# Patient Record
Sex: Female | Born: 1960 | Race: Black or African American | Hispanic: No | Marital: Single | Smoking: Never smoker
Health system: Southern US, Community
[De-identification: ages and names within clinical notes are randomized; demographics above are authoritative.]

## PROBLEM LIST (undated history)

## (undated) DIAGNOSIS — E782 Mixed hyperlipidemia: Secondary | ICD-10-CM

## (undated) DIAGNOSIS — K219 Gastro-esophageal reflux disease without esophagitis: Secondary | ICD-10-CM

## (undated) DIAGNOSIS — I10 Essential (primary) hypertension: Secondary | ICD-10-CM

## (undated) DIAGNOSIS — D219 Benign neoplasm of connective and other soft tissue, unspecified: Secondary | ICD-10-CM

## (undated) DIAGNOSIS — R7303 Prediabetes: Secondary | ICD-10-CM

## (undated) DIAGNOSIS — Z8616 Personal history of COVID-19: Secondary | ICD-10-CM

## (undated) HISTORY — DX: Prediabetes: R73.03

## (undated) HISTORY — DX: Personal history of COVID-19: Z86.16

## (undated) HISTORY — DX: Benign neoplasm of connective and other soft tissue, unspecified: D21.9

## (undated) HISTORY — DX: Gastro-esophageal reflux disease without esophagitis: K21.9

## (undated) HISTORY — DX: Mixed hyperlipidemia: E78.2

---

## 2004-11-30 ENCOUNTER — Ambulatory Visit: Payer: Self-pay | Admitting: Unknown Physician Specialty

## 2005-12-08 ENCOUNTER — Ambulatory Visit: Payer: Self-pay | Admitting: Unknown Physician Specialty

## 2006-12-12 ENCOUNTER — Ambulatory Visit: Payer: Self-pay | Admitting: Unknown Physician Specialty

## 2007-12-14 ENCOUNTER — Ambulatory Visit: Payer: Self-pay | Admitting: Unknown Physician Specialty

## 2008-12-17 ENCOUNTER — Ambulatory Visit: Payer: Self-pay | Admitting: Unknown Physician Specialty

## 2009-07-03 ENCOUNTER — Emergency Department: Payer: Self-pay | Admitting: Emergency Medicine

## 2009-09-07 ENCOUNTER — Emergency Department: Payer: Self-pay | Admitting: Emergency Medicine

## 2009-12-22 ENCOUNTER — Ambulatory Visit: Payer: Self-pay | Admitting: Unknown Physician Specialty

## 2010-05-19 ENCOUNTER — Emergency Department: Payer: Self-pay | Admitting: Emergency Medicine

## 2010-05-25 ENCOUNTER — Emergency Department: Payer: Self-pay | Admitting: Emergency Medicine

## 2010-07-16 ENCOUNTER — Emergency Department: Payer: Self-pay | Admitting: Emergency Medicine

## 2010-07-18 ENCOUNTER — Emergency Department: Payer: Self-pay | Admitting: Internal Medicine

## 2011-01-21 ENCOUNTER — Ambulatory Visit: Payer: Self-pay | Admitting: Unknown Physician Specialty

## 2012-03-11 ENCOUNTER — Emergency Department: Payer: Self-pay | Admitting: *Deleted

## 2012-03-11 LAB — CBC
HGB: 13 g/dL (ref 12.0–16.0)
MCH: 32.7 pg (ref 26.0–34.0)
MCHC: 35.8 g/dL (ref 32.0–36.0)
MCV: 91 fL (ref 80–100)
RBC: 3.98 10*6/uL (ref 3.80–5.20)
RDW: 13.4 % (ref 11.5–14.5)

## 2012-03-11 LAB — BASIC METABOLIC PANEL
Calcium, Total: 9.1 mg/dL (ref 8.5–10.1)
Chloride: 106 mmol/L (ref 98–107)
Co2: 25 mmol/L (ref 21–32)
EGFR (African American): >60
EGFR (Non-African Amer.): >60
Osmolality: 278 (ref 275–301)
Potassium: 3.7 mmol/L (ref 3.5–5.1)
Sodium: 139 mmol/L (ref 136–145)

## 2012-03-11 LAB — TROPONIN I: Troponin-I: <0.02 ng/mL

## 2012-03-11 LAB — CK TOTAL AND CKMB (NOT AT ARMC)
CK, Total: 146 U/L (ref 21–215)
CK-MB: 0.7 ng/mL (ref 0.5–3.6)

## 2015-03-06 ENCOUNTER — Telehealth: Payer: Self-pay | Admitting: *Deleted

## 2015-03-06 NOTE — Telephone Encounter (Signed)
Opened in error

## 2015-04-19 ENCOUNTER — Emergency Department
Admission: EM | Admit: 2015-04-19 | Discharge: 2015-04-19 | Disposition: A | Discharge status: Home / Self Care | Payer: 59 | Attending: Emergency Medicine | Admitting: Emergency Medicine

## 2015-04-19 ENCOUNTER — Encounter: Payer: Self-pay | Admitting: Emergency Medicine

## 2015-04-19 DIAGNOSIS — Z79899 Other long term (current) drug therapy: Secondary | ICD-10-CM | POA: Diagnosis not present

## 2015-04-19 DIAGNOSIS — T7840XA Allergy, unspecified, initial encounter: Secondary | ICD-10-CM | POA: Insufficient documentation

## 2015-04-19 DIAGNOSIS — Y9389 Activity, other specified: Secondary | ICD-10-CM | POA: Diagnosis not present

## 2015-04-19 DIAGNOSIS — Y998 Other external cause status: Secondary | ICD-10-CM | POA: Diagnosis not present

## 2015-04-19 DIAGNOSIS — I10 Essential (primary) hypertension: Secondary | ICD-10-CM | POA: Diagnosis not present

## 2015-04-19 DIAGNOSIS — Y9289 Other specified places as the place of occurrence of the external cause: Secondary | ICD-10-CM | POA: Diagnosis not present

## 2015-04-19 DIAGNOSIS — R220 Localized swelling, mass and lump, head: Secondary | ICD-10-CM | POA: Insufficient documentation

## 2015-04-19 DIAGNOSIS — X58XXXA Exposure to other specified factors, initial encounter: Secondary | ICD-10-CM | POA: Diagnosis not present

## 2015-04-19 HISTORY — DX: Essential (primary) hypertension: I10

## 2015-04-19 NOTE — ED Notes (Addendum)
Pt presents to ER with swollen lips; states she woke up around 11:30 last night noticing lip swelling; pt denies trying any new food or medication; pt called EMS around 1215 and took two benadryl and went back to sleep; pt states she came to ER when lips were still swollen when she woke up around 630; pt ambulatory in triage with no apparent distress noted

## 2015-04-19 NOTE — ED Notes (Signed)
States she developed facial swelling ,mainly around lips last pm. Had taken benadryl last pm but this am lips are still swollen and feels like throat is scratchy and swelling some. No drooling or resp distress noted

## 2015-04-19 NOTE — ED Provider Notes (Signed)
[facility] Emergency Department Provider Note  ____________________________________________  Time seen: Approximately 7:25 AM  I have reviewed the triage vital signs and the nursing notes.   HISTORY  Chief Complaint Allergic Reaction    HPI Jasmine Buck is a 54 y.o. female who presents to the emergency department complaining of lip swelling. She states that she woke up around 11:30 PM last night and noticed that her lips were swelling. She became concerned that she went to her sister's house to confirm that her lips or in fact swelling. At that time he called 911 and EMS arrived. Evaluated and given Benadryl and then refused transport. He states that she slept through the night and then woke up 6:30 this morning complaining of the same symptoms. She denies any shortness of breath, difficulty swallowing, generalized hives, itching, rash. She states that she has not tried any new foods, medications, soaps/shampoo, or beauty products.   Past Medical History  Diagnosis Date  . Hypertension     There are no active problems to display for this patient.   History reviewed. No pertinent past surgical history.  Current Outpatient Rx  Name  Route  Sig  Dispense  Refill  . amLODipine-benazepril (LOTREL) 5-10 MG capsule   Oral   Take 1 capsule by mouth daily.         . metoprolol succinate (TOPROL-XL) 50 MG 24 hr tablet   Oral   Take 50 mg by mouth daily. Take with or immediately following a meal.           Allergies Review of patient's allergies indicates not on file.  History reviewed. No pertinent family history.  Social History Social History  Substance Use Topics  . Smoking status: Never Smoker   . Smokeless tobacco: None  . Alcohol Use: No    Review of Systems Constitutional: No fever/chills Eyes: No visual changes. ENT: No sore throat. Cardiovascular: Denies chest pain. Respiratory: Denies shortness of breath. Gastrointestinal:  No abdominal pain.  No nausea, no vomiting.  No diarrhea.  No constipation. Genitourinary: Negative for dysuria. Musculoskeletal: Negative for back pain. Skin: Negative for rash. Edema to the upper lip. Neurological: Negative for headaches, focal weakness or numbness.  10-point ROS otherwise negative.  ____________________________________________   PHYSICAL EXAM:  VITAL SIGNS: ED Triage Vitals  Enc Vitals Group     BP 04/19/15 0706 125/65 mmHg     Pulse Rate 04/19/15 0706 93     Resp 04/19/15 0706 16     Temp 04/19/15 0706 98.2 F (36.8 C)     Temp Source 04/19/15 0706 Oral     SpO2 04/19/15 0706 100 %     Weight 04/19/15 0706 181 lb (82.101 kg)     Height 04/19/15 0706 5\' 2"  (1.575 m)     Head Cir --      Peak Flow --      Pain Score 04/19/15 0707 1     Pain Loc --      Pain Edu? --      Excl. in [place]? --     Constitutional: Alert and oriented. Well appearing and in no acute distress. Eyes: Conjunctivae are normal. PERRL. EOMI. Head: Atraumatic. Nose: No congestion/rhinnorhea. Mouth/Throat: Mucous membranes are moist.  Oropharynx non-erythematous. No edema noted. Tonsils nonedematous. Neck: No stridor.   Hematological/Lymphatic/Immunilogical: No cervical lymphadenopathy. Cardiovascular: Normal rate, regular rhythm. Grossly normal heart sounds.  Good peripheral circulation. Respiratory: Normal respiratory effort.  No retractions. Lungs CTAB. Gastrointestinal: Soft and  nontender. No distention. No abdominal bruits. No CVA tenderness. Musculoskeletal: No lower extremity tenderness nor edema.  No joint effusions. Neurologic:  Normal speech and language. No gross focal neurologic deficits are appreciated. No gait instability. Skin:  Skin is warm, dry and intact. No rash noted. Edema noted to upper lip.  Psychiatric: Mood and affect are normal. Speech and behavior are normal.  ____________________________________________   LABS (all labs ordered are listed, but only  abnormal results are displayed)  Labs Reviewed - No data to display ____________________________________________  EKG   ____________________________________________  RADIOLOGY   ____________________________________________   PROCEDURES  Procedure(s) performed: None  Critical Care performed: No  ____________________________________________   INITIAL IMPRESSION / ASSESSMENT AND PLAN / ED COURSE  Pertinent labs & imaging results that were available during my care of the patient were reviewed by me and considered in my medical decision making (see chart for details).  Patient's history, symptoms, and exam are consistent with allergic reaction to an unknown substance. The patient does not have any kind of respiratory distress. There is no wheezing, difficulty swallowing, difficulty breathing. There is no hives/rash noted. Asked findings with patient and diagnosis. Advised patient to continue taking Benadryl 50 mg every 4 hours. Also advised patient that she did take 4 times daily recommended dose of Zyrtec and Benadryl caused drowsiness. Patient verbalized understanding of same. Gave patient strict ED precautions to return should symptoms worsen, she develops any type of respiratory complaints, or she experiences difficulty swallowing. She verbalized understanding of same. She verbalizes compliance with all the above. ____________________________________________   FINAL CLINICAL IMPRESSION(S) / ED DIAGNOSES  Final diagnoses:  Allergic reaction, initial encounter      Darletta Moll, PA-C 04/19/15 0800  Schuyler Amor, MD 04/19/15 [phone]

## 2015-04-19 NOTE — Discharge Instructions (Signed)
Food Allergy A food allergy occurs from eating something you are sensitive to. Food allergies occur in all age groups. It may be passed to you from your parents (heredity).  CAUSES  Some common causes are cow's milk, seafood, eggs, nuts (including peanut butter), wheat, and soybeans. SYMPTOMS  Common problems are:   Swelling around the mouth.  An itchy, red rash.  Hives.  Vomiting.  Diarrhea. Severe allergic reactions are life-threatening. This reaction is called anaphylaxis. It can cause the mouth and throat to swell. This makes it hard to breathe and swallow. In severe reactions, only a small amount of food may be fatal within seconds. HOME CARE INSTRUCTIONS   If you are unsure what caused the reaction, keep a diary of foods eaten and symptoms that followed. Avoid foods that cause reactions.  If hives or rash are present:  Take medicines as directed.  Use an over-the-counter antihistamine (diphenhydramine) to treat hives and itching as needed.  Apply cold compresses to the skin or take baths in cool water. Avoid hot baths or showers. These will increase the redness and itching.  If you are severely allergic:  Hospitalization is often required following a severe reaction.  Wear a medical alert bracelet or necklace that describes the allergy.  Carry your anaphylaxis kit or epinephrine injection with you at all times. Both you and your family members should know how to use this. This can be lifesaving if you have a severe reaction. If epinephrine is used, it is important for you to seek immediate medical care or call your local emergency services (911 in [place] When the epinephrine wears off, it can be followed by a delayed reaction, which can be fatal.  Replace your epinephrine immediately after use in case of another reaction.  Ask your caregiver for instructions if you have not been taught how to use an epinephrine injection.  Do not drive until medicines used to treat the  reaction have worn off, unless approved by your caregiver. SEEK MEDICAL CARE IF:   You suspect a food allergy. Symptoms generally happen within 30 minutes of eating a food.  Your symptoms have not gone away within 2 days. See your caregiver sooner if symptoms are getting worse.  You develop new symptoms.  You want to retest yourself with a food or drink you think causes an allergic reaction. Never do this if an anaphylactic reaction to that food or drink has happened before.  There is a return of the symptoms which brought you to your caregiver. SEEK IMMEDIATE MEDICAL CARE IF:   You have trouble breathing, are wheezing, or you have a tight feeling in your chest or throat.  You have a swollen mouth, or you have hives, swelling, or itching all over your body. Use your epinephrine injection immediately. This is given into the outside of your thigh, deep into the muscle. Following use of the epinephrine injection, seek help right away. Seek immediate medical care or call your local emergency services (911 in [place] MAKE SURE YOU:   Understand these instructions.  Will watch your condition.  Will get help right away if you are not doing well or get worse. Document Released: 07/01/2000 Document Revised: 09/26/2011 Document Reviewed: 02/21/2008 [facility] Patient Information 2015 [place], [place]. This information is not intended to replace advice given to you by your health care provider. Make sure you discuss any questions you have with your health care provider.   Continue to take Benadryl for symptom relief. Return immediately to the emergency  department or call 911 for any increase of symptoms that include shortness of breath, difficulty swallowing, increased swelling.

## 2016-04-08 DIAGNOSIS — I499 Cardiac arrhythmia, unspecified: Secondary | ICD-10-CM | POA: Insufficient documentation

## 2016-04-08 DIAGNOSIS — I10 Essential (primary) hypertension: Secondary | ICD-10-CM | POA: Insufficient documentation

## 2016-04-08 DIAGNOSIS — R7303 Prediabetes: Secondary | ICD-10-CM | POA: Insufficient documentation

## 2016-04-23 ENCOUNTER — Encounter: Payer: Self-pay | Admitting: Emergency Medicine

## 2016-04-23 ENCOUNTER — Emergency Department
Admission: EM | Admit: 2016-04-23 | Discharge: 2016-04-23 | Disposition: A | Discharge status: Home / Self Care | Payer: 59 | Attending: Emergency Medicine | Admitting: Emergency Medicine

## 2016-04-23 DIAGNOSIS — I10 Essential (primary) hypertension: Secondary | ICD-10-CM | POA: Insufficient documentation

## 2016-04-23 DIAGNOSIS — H16142 Punctate keratitis, left eye: Secondary | ICD-10-CM | POA: Diagnosis not present

## 2016-04-23 DIAGNOSIS — H5712 Ocular pain, left eye: Secondary | ICD-10-CM | POA: Diagnosis present

## 2016-04-23 MED ORDER — OFLOXACIN 0.3 % OT SOLN: 5.0000 [drp] | Freq: Two times a day (BID) | OTIC | 0 refills | Status: AC

## 2016-04-23 MED ORDER — CIPROFLOXACIN HCL 0.3 % OP SOLN: 1.0000 [drp] | OPHTHALMIC | 0 refills | Status: AC

## 2016-04-23 MED ORDER — TETRACAINE HCL 0.5 % OP SOLN
OPHTHALMIC | Status: AC
  Filled 2016-04-23: qty 2

## 2016-04-23 MED ORDER — FLUORESCEIN SODIUM 1 MG OP STRP
ORAL_STRIP | OPHTHALMIC | Status: AC
  Filled 2016-04-23: qty 1

## 2016-04-23 NOTE — ED Notes (Signed)
Pt stating that her left eye has been sensitive to light and hurting. Pt stating that the people at the [facility] stated that it was not "pink eye." Pt stating that she has clear drainage or "tearing up" because of light this morning.  Pt denying any injuries. Pt does wear contacts.

## 2016-04-23 NOTE — ED Triage Notes (Signed)
States red left eye x 2 days. Still wearing her contacts

## 2016-04-23 NOTE — ED Provider Notes (Signed)
[facility] Emergency Department Provider Note ____________________________________________  Time seen: Approximately 2:31 PM  I have reviewed the triage vital signs and the nursing notes.   HISTORY  Chief Complaint Conjunctivitis   HPI Jasmine Buck is a 55 y.o. female who presents to the emergency department for left eyes sensitivity and pain. [facility] and was told that she does not have "pinkeye." States that she has clear drainage this morning. She denies injury. She does wear contact lenses.  Past Medical History:  Diagnosis Date  . Hypertension     There are no active problems to display for this patient.   History reviewed. No pertinent surgical history.  Prior to Admission medications   Medication Sig Start Date End Date Taking? Authorizing Provider  amLODipine-benazepril (LOTREL) 5-10 MG capsule Take 1 capsule by mouth daily.    Historical Provider, MD  ciprofloxacin (CILOXAN) 0.3 % ophthalmic solution Place 1 drop into the left eye every 4 (four) hours while awake. 04/23/16 04/28/16  Victorino Dike, FNP  metoprolol succinate (TOPROL-XL) 50 MG 24 hr tablet Take 50 mg by mouth daily. Take with or immediately following a meal.    Historical Provider, MD  ofloxacin (FLOXIN) 0.3 % otic solution Place 5 drops into both ears 2 (two) times daily. 04/23/16 05/03/16  Victorino Dike, FNP    Allergies Review of patient's allergies indicates no known allergies.  History reviewed. No pertinent family history.  Social History Social History  Substance Use Topics  . Smoking status: Never Smoker  . Smokeless tobacco: Never Used  . Alcohol use No    Review of Systems   Constitutional: No fever/chills Eyes: Negative for visual changes. Positive for photosensitivity. Musculoskeletal: Negative for pain. Skin: Negative for rash. Neurological: Negative for headaches, focal weakness or numbness. Allergic: Negative for  seasonal allergies. ____________________________________________  PHYSICAL EXAM:  VITAL SIGNS: ED Triage Vitals [04/23/16 1318]  Enc Vitals Group     BP      Pulse      Resp      Temp      Temp src      SpO2      Weight      Height      Head Circumference      Peak Flow      Pain Score 2     Pain Loc      Pain Edu?      Excl. in [place]?     Constitutional: Alert and oriented. Well appearing and in no acute distress. Eyes: Visual acuity--see nursing documentation; no globe trauma; Eyelids normal to inspection; Sclera appears anicteric.  Eyelids were inverted. Conjunctiva appears mildly erythematous; Cornea with punctate lesions on fluorescein exam. Head: Atraumatic. Nose: No congestion/rhinnorhea. Mouth/Throat: Mucous membranes are moist.  Oropharynx non-erythematous. Respiratory: Even and unlabored Musculoskeletal:Normal ROM x 4 extremities. Neurologic:  Normal speech and language. No gross focal neurologic deficits are appreciated. Speech is normal. No gait instability. Skin:  Skin is warm, dry and intact. No rash noted. Psychiatric: Mood and affect are normal. Speech and behavior are normal.  ____________________________________________   LABS (all labs ordered are listed, but only abnormal results are displayed)  Labs Reviewed - No data to display ____________________________________________  EKG   ____________________________________________  RADIOLOGY  Not indicated ____________________________________________   PROCEDURES  Procedure(s) performed: None ____________________________________________   INITIAL IMPRESSION / ASSESSMENT AND PLAN / ED COURSE  Pertinent labs & imaging results that were available during  my care of the patient were reviewed by me and considered in my medical decision making (see chart for details).  Clinical Course    Patient to receive prescriptions for Ciprofloxacin eyedrops.  She was advised to follow up with her  ophthalmologist for symptoms that are not improving over the next 2 days. She was  also advised to return to the ER for symptoms that change or worsen if unable to schedule an appointment.  ____________________________________________   FINAL CLINICAL IMPRESSION(S) / ED DIAGNOSES  Final diagnoses:  Punctate keratitis of left eye    Note:  This document was prepared using Dragon voice recognition software and may include unintentional dictation errors.    Victorino Dike, FNP 04/23/16 1815    Lisa Roca, MD 04/24/16 1535

## 2016-05-02 ENCOUNTER — Other Ambulatory Visit: Payer: Self-pay | Admitting: Obstetrics & Gynecology

## 2016-05-02 DIAGNOSIS — Z1231 Encounter for screening mammogram for malignant neoplasm of breast: Secondary | ICD-10-CM

## 2016-06-02 ENCOUNTER — Ambulatory Visit
Admission: RE | Admit: 2016-06-02 | Discharge: 2016-06-02 | Disposition: A | Discharge status: Home / Self Care | Payer: 59 | Source: Ambulatory Visit | Attending: Obstetrics & Gynecology | Admitting: Obstetrics & Gynecology

## 2016-06-02 DIAGNOSIS — Z1231 Encounter for screening mammogram for malignant neoplasm of breast: Secondary | ICD-10-CM | POA: Insufficient documentation

## 2016-06-07 ENCOUNTER — Other Ambulatory Visit: Payer: Self-pay | Admitting: *Deleted

## 2016-06-07 ENCOUNTER — Inpatient Hospital Stay
Admission: RE | Admit: 2016-06-07 | Discharge: 2016-06-07 | Disposition: A | Discharge status: Home / Self Care | Payer: Self-pay | Source: Ambulatory Visit | Attending: *Deleted | Admitting: *Deleted

## 2016-06-07 DIAGNOSIS — Z9289 Personal history of other medical treatment: Secondary | ICD-10-CM

## 2017-04-11 ENCOUNTER — Other Ambulatory Visit: Payer: Self-pay | Admitting: Obstetrics & Gynecology

## 2017-04-11 DIAGNOSIS — Z1231 Encounter for screening mammogram for malignant neoplasm of breast: Secondary | ICD-10-CM

## 2017-06-05 ENCOUNTER — Ambulatory Visit
Admission: RE | Admit: 2017-06-05 | Discharge: 2017-06-05 | Disposition: A | Discharge status: Home / Self Care | Payer: 59 | Source: Ambulatory Visit | Attending: Obstetrics & Gynecology | Admitting: Obstetrics & Gynecology

## 2017-06-05 DIAGNOSIS — Z1231 Encounter for screening mammogram for malignant neoplasm of breast: Secondary | ICD-10-CM

## 2018-02-03 ENCOUNTER — Encounter: Payer: Self-pay | Admitting: Emergency Medicine

## 2018-02-03 ENCOUNTER — Emergency Department
Admission: EM | Admit: 2018-02-03 | Discharge: 2018-02-03 | Disposition: A | Discharge status: Home / Self Care | Payer: Managed Care, Other (non HMO) | Attending: Emergency Medicine | Admitting: Emergency Medicine

## 2018-02-03 ENCOUNTER — Emergency Department: Payer: Managed Care, Other (non HMO)

## 2018-02-03 DIAGNOSIS — R002 Palpitations: Secondary | ICD-10-CM

## 2018-02-03 DIAGNOSIS — Z79899 Other long term (current) drug therapy: Secondary | ICD-10-CM | POA: Diagnosis not present

## 2018-02-03 DIAGNOSIS — I10 Essential (primary) hypertension: Secondary | ICD-10-CM | POA: Insufficient documentation

## 2018-02-03 DIAGNOSIS — R000 Tachycardia, unspecified: Secondary | ICD-10-CM | POA: Diagnosis present

## 2018-02-03 LAB — BASIC METABOLIC PANEL
Anion gap: 9 (ref 5–15)
BUN: 13 mg/dL (ref 6–20)
CHLORIDE: 106 mmol/L (ref 98–111)
CO2: 25 mmol/L (ref 22–32)
Calcium: 9.7 mg/dL (ref 8.9–10.3)
Creatinine, Ser: 0.91 mg/dL (ref 0.44–1.00)
GFR calc Af Amer: >60 mL/min (ref >60)
GFR calc non Af Amer: >60 mL/min (ref >60)
Glucose, Bld: 145 mg/dL — ABNORMAL HIGH (ref 70–99)
Potassium: 3.7 mmol/L (ref 3.5–5.1)
Sodium: 140 mmol/L (ref 135–145)

## 2018-02-03 LAB — CBC
HCT: 36.1 % (ref 35.0–47.0)
Hemoglobin: 12.6 g/dL (ref 12.0–16.0)
MCH: 29.9 pg (ref 26.0–34.0)
MCHC: 34.8 g/dL (ref 32.0–36.0)
MCV: 85.9 fL (ref 80.0–100.0)
Platelets: 374 10*3/uL (ref 150–440)
RBC: 4.2 MIL/uL (ref 3.80–5.20)
RDW: 14.3 % (ref 11.5–14.5)
WBC: 8.2 10*3/uL (ref 3.6–11.0)

## 2018-02-03 LAB — TSH: TSH: 2.042 u[IU]/mL (ref 0.350–4.500)

## 2018-02-03 LAB — TROPONIN I

## 2018-02-03 NOTE — ED Triage Notes (Signed)
PT arrived via ems with concerns over her heart which pt reports "felt like it was racing." pt denies any pain and is in NAD. HR 103 currently

## 2018-02-03 NOTE — ED Notes (Signed)
.   Pt is resting, Respirations even and unlabored, NAD. Stretcher lowest postion and locked. Call bell within reach. Denies any needs at this time RN will continue to monitor.    

## 2018-02-03 NOTE — ED Provider Notes (Signed)
[facility] Emergency Department Provider Note  ____________________________________________   I have reviewed the triage vital signs and the nursing notes. Where available I have reviewed prior notes and, if possible and indicated, outside hospital notes.    HISTORY  Chief Complaint Tachycardia    HPI Jasmine Buck is a 56 y.o. female who presents today complaining of having her heart rate go fast this morning.  She checked her pulse and it was 108.  She states she became very anxious about this and she called 911.  She had no chest pain no shortness of breath no nausea no vomiting no personal family history of PE or DVT no leg swelling no respiratory issues, no dyspnea, no exertional symptoms, no chest pain, she states that she has had this happen to her before she does have a history of anxiety she states.  She has no SI or HI and she does not feel particularly anxious at this time but she does state that she was anxious this morning.  She states usually when these things happen the last for a brief period of time go away.  She states she has a sensation of palpitations.  She is never had a Holter monitor seen a cardiologist.  She has no symptoms at this time and she is eager to go home if possible.   Past Medical History:  Diagnosis Date  . Hypertension     There are no active problems to display for this patient.   History reviewed. No pertinent surgical history.  Prior to Admission medications   Medication Sig Start Date End Date Taking? Authorizing Provider  amLODipine-benazepril (LOTREL) 5-10 MG capsule Take 1 capsule by mouth daily.    [provider]  metoprolol succinate (TOPROL-XL) 50 MG 24 hr tablet Take 50 mg by mouth daily. Take with or immediately following a meal.    [provider]    Allergies Patient has no known allergies.  No family history on file.  Social History Social History   Tobacco Use  . Smoking  status: Never Smoker  . Smokeless tobacco: Never Used  Substance Use Topics  . Alcohol use: No  . Drug use: No    Review of Systems Constitutional: No fever/chills Eyes: No visual changes. ENT: No sore throat. No stiff neck no neck pain Cardiovascular: Denies chest pain. Respiratory: Denies shortness of breath. Gastrointestinal:   no vomiting.  No diarrhea.  No constipation. Genitourinary: Negative for dysuria. Musculoskeletal: Negative lower extremity swelling Skin: Negative for rash. Neurological: Negative for severe headaches, focal weakness or numbness.   ____________________________________________   PHYSICAL EXAM:  VITAL SIGNS: ED Triage Vitals  Enc Vitals Group     BP 02/03/18 0957 (!) 148/68     Pulse Rate 02/03/18 0957 100     Resp 02/03/18 0957 (!) 21     Temp 02/03/18 0957 98.3 F (36.8 C)     Temp Source 02/03/18 0957 Oral     SpO2 02/03/18 0957 98 %     Weight 02/03/18 0958 188 lb (85.3 kg)     Height 02/03/18 0958 5\' 2"  (1.575 m)     Head Circumference --      Peak Flow --      Pain Score 02/03/18 0958 0     Pain Loc --      Pain Edu? --      Excl. in [place]? --     Constitutional: Alert and oriented. Well appearing and in no acute  distress. Eyes: Conjunctivae are normal Head: Atraumatic HEENT: No congestion/rhinnorhea. Mucous membranes are moist.  Oropharynx non-erythematous Neck:   Nontender with no meningismus, no masses, no stridor Cardiovascular: Normal rate, regular rhythm. Grossly normal heart sounds.  Good peripheral circulation. Respiratory: Normal respiratory effort.  No retractions. Lungs CTAB. Abdominal: Soft and nontender. No distention. No guarding no rebound Back:  There is no focal tenderness or step off.  there is no midline tenderness there are no lesions noted. there is no CVA tenderness Musculoskeletal: No lower extremity tenderness, no upper extremity tenderness. No joint effusions, no DVT signs strong distal pulses no  edema Neurologic:  Normal speech and language. No gross focal neurologic deficits are appreciated.  Skin:  Skin is warm, dry and intact. No rash noted. Psychiatric: Mood and affect are somewhat anxious. Speech and behavior are normal.  ____________________________________________   LABS (all labs ordered are listed, but only abnormal results are displayed)  Labs Reviewed  BASIC METABOLIC PANEL - Abnormal; Notable for the following components:      Result Value   Glucose, Bld 145 (*)    All other components within normal limits  CBC  TROPONIN I  TSH    Pertinent labs  results that were available during my care of the patient were reviewed by me and considered in my medical decision making (see chart for details). ____________________________________________  EKG  I personally interpreted any EKGs ordered by me or triage Sinus rhythm rate 90 bpm no acute ST elevation or depression, nonspecific ST changes, no acute ischemia.  QTc 419 QT 320 ____________________________________________  RADIOLOGY  Pertinent labs & imaging results that were available during my care of the patient were reviewed by me and considered in my medical decision making (see chart for details). If possible, patient and/or family made aware of any abnormal findings.  Dg Chest 2 View  Result Date: 02/03/2018 CLINICAL DATA:  Chest discomfort, weakness, tachycardia EXAM: CHEST - 2 VIEW COMPARISON:  03/11/2012 FINDINGS: The heart size and mediastinal contours are within normal limits. Both lungs are clear. The visualized skeletal structures are unremarkable except for diffuse thoracic spondylosis. Trachea is midline. IMPRESSION: No active cardiopulmonary disease. Electronically Signed   By: Jerilynn Mages.  Shick M.D.   On: 02/03/2018 10:34   ____________________________________________    PROCEDURES  Procedure(s) performed: None  Procedures  Critical Care performed:  None  ____________________________________________   INITIAL IMPRESSION / ASSESSMENT AND PLAN / ED COURSE  Pertinent labs & imaging results that were available during my care of the patient were reviewed by me and considered in my medical decision making (see chart for details).  Patient with palpitations this morning asymptomatic otherwise, in no acute distress here, EKG does not show any dysrhythmia, patient has no risk factors for PE and actually no signs or symptoms of PE, nothing to suggest ACS, there is actually no chest pain or shortness of breath she just felt her heart beating rapidly.  TSH is reassuring she is not anemic, vital signs are reassuring here EKG is reassuring we will discharge her with close outpatient follow-up for Holter monitor.  Precautions and follow-up given and understood.    ____________________________________________   FINAL CLINICAL IMPRESSION(S) / ED DIAGNOSES  Final diagnoses:  None      This chart was dictated using voice recognition software.  Despite best efforts to proofread,  errors can occur which can change meaning.      Schuyler Amor, MD 02/03/18 [phone]

## 2018-02-03 NOTE — ED Notes (Signed)
Patient transported to X-ray 

## 2018-04-13 ENCOUNTER — Other Ambulatory Visit: Payer: Self-pay | Admitting: Obstetrics & Gynecology

## 2018-04-13 DIAGNOSIS — Z1231 Encounter for screening mammogram for malignant neoplasm of breast: Secondary | ICD-10-CM

## 2018-06-08 ENCOUNTER — Ambulatory Visit
Admission: RE | Admit: 2018-06-08 | Discharge: 2018-06-08 | Disposition: A | Discharge status: Home / Self Care | Payer: Managed Care, Other (non HMO) | Source: Ambulatory Visit | Attending: Obstetrics & Gynecology | Admitting: Obstetrics & Gynecology

## 2018-06-08 DIAGNOSIS — Z1231 Encounter for screening mammogram for malignant neoplasm of breast: Secondary | ICD-10-CM | POA: Diagnosis not present

## 2019-01-20 ENCOUNTER — Other Ambulatory Visit: Payer: Self-pay

## 2019-01-20 ENCOUNTER — Emergency Department
Admission: EM | Admit: 2019-01-20 | Discharge: 2019-01-20 | Disposition: A | Discharge status: Home / Self Care | Payer: Managed Care, Other (non HMO) | Attending: Emergency Medicine | Admitting: Emergency Medicine

## 2019-01-20 DIAGNOSIS — E860 Dehydration: Secondary | ICD-10-CM | POA: Insufficient documentation

## 2019-01-20 DIAGNOSIS — I10 Essential (primary) hypertension: Secondary | ICD-10-CM | POA: Insufficient documentation

## 2019-01-20 DIAGNOSIS — R42 Dizziness and giddiness: Secondary | ICD-10-CM | POA: Insufficient documentation

## 2019-01-20 LAB — URINALYSIS, COMPLETE (UACMP) WITH MICROSCOPIC
Bacteria, UA: NONE SEEN
Bilirubin Urine: NEGATIVE
Glucose, UA: NEGATIVE mg/dL
Ketones, ur: NEGATIVE mg/dL
Leukocytes,Ua: NEGATIVE
Nitrite: NEGATIVE
Protein, ur: NEGATIVE mg/dL
Specific Gravity, Urine: 1.011 (ref 1.005–1.030)
pH: 6 (ref 5.0–8.0)

## 2019-01-20 LAB — CBC
HCT: 38.3 % (ref 36.0–46.0)
Hemoglobin: 12.4 g/dL (ref 12.0–15.0)
MCH: 28.2 pg (ref 26.0–34.0)
MCHC: 32.4 g/dL (ref 30.0–36.0)
MCV: 87.2 fL (ref 80.0–100.0)
Platelets: 353 10*3/uL (ref 150–400)
RBC: 4.39 MIL/uL (ref 3.87–5.11)
RDW: 13.2 % (ref 11.5–15.5)
WBC: 7.6 10*3/uL (ref 4.0–10.5)
nRBC: 0 % (ref 0.0–0.2)

## 2019-01-20 LAB — BASIC METABOLIC PANEL
Anion gap: 8 (ref 5–15)
BUN: 16 mg/dL (ref 6–20)
CO2: 24 mmol/L (ref 22–32)
Calcium: 9.2 mg/dL (ref 8.9–10.3)
Chloride: 104 mmol/L (ref 98–111)
Creatinine, Ser: 0.81 mg/dL (ref 0.44–1.00)
GFR calc Af Amer: >60 mL/min (ref >60)
GFR calc non Af Amer: >60 mL/min (ref >60)
Glucose, Bld: 113 mg/dL — ABNORMAL HIGH (ref 70–99)
Potassium: 3.8 mmol/L (ref 3.5–5.1)
Sodium: 136 mmol/L (ref 135–145)

## 2019-01-20 MED ORDER — SODIUM CHLORIDE 0.9 % IV BOLUS
1000.0000 mL | Freq: Once | INTRAVENOUS | Status: AC
  Administered 2019-01-20: 1000 mL via INTRAVENOUS

## 2019-01-20 NOTE — ED Triage Notes (Addendum)
Pt arrived via POV with reports of lightheadedness that started this morning that was intermittent several times since this morning. Pt states she was sitting on the deck drinking coffee when it happened.   Denies any pain, vision changes  Pt does have hx of HTN and took med about 10am.

## 2019-01-20 NOTE — ED Provider Notes (Signed)
[facility] Emergency Department Provider Note   ____________________________________________   First MD Initiated Contact with Patient 01/20/19 1107     (approximate)  I have reviewed the triage vital signs and the nursing notes.   HISTORY  Chief Complaint Dizziness    HPI Jasmine Buck is a 58 y.o. female here for evaluation for feeling lightheaded this morning  Patient reports she got up, she was sitting outside and just started drinking a couple coffee as she was seated she started to feel very lightheaded.  She did not pass out or anything, drank a glass of water after that she also felt thirsty.  Then again she had another episode when she was where she feels lightheaded.  No chest pain no shortness of breath.  No nausea no vomiting.  History of hypertension and prediabetes.   No headache.  No fevers or chills.  No recent illness.  No exposure to coronavirus.  Works from home.  Patient did have a few alcoholic beverages last night to celebrate 4 July.  She also reports she has not had breakfast yet today  Past Medical History:  Diagnosis Date  . Hypertension     There are no active problems to display for this patient.   No past surgical history on file.  Prior to Admission medications   Medication Sig Start Date End Date Taking? Authorizing Provider  amLODipine-benazepril (LOTREL) 5-10 MG capsule Take 1 capsule by mouth daily.    [provider]  metoprolol succinate (TOPROL-XL) 50 MG 24 hr tablet Take 50 mg by mouth daily. Take with or immediately following a meal.    [provider]  Patient reports she is stopped taking Benzapril, she has an ACE allergy  Allergies Ace inhibitors  No family history on file.  Social History Social History   Tobacco Use  . Smoking status: Never Smoker  . Smokeless tobacco: Never Used  Substance Use Topics  . Alcohol use: No  . Drug use: No  Normally does not drink  regularly, last night had a few drinks to celebrate 4 July  Review of Systems Constitutional: No fever/chills Eyes: No visual changes. ENT: No sore throat. Cardiovascular: Denies chest pain. Respiratory: Denies shortness of breath. Gastrointestinal: No abdominal pain.   Genitourinary: Negative for dysuria.  Is in menopause. Musculoskeletal: Negative for back pain. Skin: Negative for rash. Neurological: Negative for headaches, areas of focal weakness or numbness.  Just felt lightheaded with sitting up and standing.  No numbness or weakness.  No droop of the face.  No trouble speaking.    ____________________________________________   PHYSICAL EXAM:  VITAL SIGNS: ED Triage Vitals  Enc Vitals Group     BP 01/20/19 1028 (!) 133/57     Pulse Rate 01/20/19 1028 96     Resp 01/20/19 1028 18     Temp 01/20/19 1028 98.6 F (37 C)     Temp Source 01/20/19 1028 Oral     SpO2 01/20/19 1028 98 %     Weight 01/20/19 1026 189 lb (85.7 kg)     Height 01/20/19 1026 5\' 2"  (1.575 m)     Head Circumference --      Peak Flow --      Pain Score 01/20/19 1026 0     Pain Loc --      Pain Edu? --      Excl. in [place]? --     Constitutional: Alert and oriented. Well appearing and in no acute  distress. Eyes: Conjunctivae are normal. Head: Atraumatic. Nose: No congestion/rhinnorhea. Mouth/Throat: Mucous membranes are moist. Neck: No stridor.  Cardiovascular: Normal rate, regular rhythm. Grossly normal heart sounds.  Good peripheral circulation. Respiratory: Normal respiratory effort.  No retractions. Lungs CTAB. Gastrointestinal: Soft and nontender. No distention. Musculoskeletal: No lower extremity tenderness nor edema. Neurologic:  Normal speech and language. No gross focal neurologic deficits are appreciated.  No pronator drift in arms or legs.  Cranial nerve exam normal.  Extraocular movements normal.  Normal sensation and strength in all extremities.  Clear speech.  No ataxia Skin:  Skin is  warm, dry and intact. No rash noted. Psychiatric: Mood and affect are normal. Speech and behavior are normal.  ____________________________________________   LABS (all labs ordered are listed, but only abnormal results are displayed)  Labs Reviewed  BASIC METABOLIC PANEL - Abnormal; Notable for the following components:      Result Value   Glucose, Bld 113 (*)    All other components within normal limits  URINALYSIS, COMPLETE (UACMP) WITH MICROSCOPIC - Abnormal; Notable for the following components:   Color, Urine YELLOW (*)    APPearance CLEAR (*)    Hgb urine dipstick MODERATE (*)    All other components within normal limits  CBC   ____________________________________________  EKG  Reviewed entered by me at 11 AM Heart rate 90 QRS 90 QTc 440 Normal sinus rhythm, no evidence of acute ischemia ____________________________________________    Normal and reassuring neurologic exam without acute neurologic symptoms.  No indication for imaging ____________________________________________   PROCEDURES  Procedure(s) performed: None  Procedures  Critical Care performed: No  ____________________________________________   INITIAL IMPRESSION / ASSESSMENT AND PLAN / ED COURSE  Pertinent labs & imaging results that were available during my care of the patient were reviewed by me and considered in my medical decision making (see chart for details).   Lightheadedness.  Symptoms occurring when sitting up or standing.  Given her history of alcohol use last night to celebrate 4 July, getting up this morning and not having had breakfast or drink anything I suspect she may be dehydrated.  We will start IV fluids, check lab work to assure normal electrolytes and creatinine.  Also check for acute anemia.  She denies any acute cardiopulmonary neurologic symptoms.  Her symptoms seem very positional and I suspect dehydration is likely to blame here, but wish to exclude other causes.  Will  hydrate and reevaluate.     ----------------------------------------- 1:15 PM on 01/20/2019 -----------------------------------------  Patient resting comfortably.  She feels much better.  No ongoing symptoms or lightheadedness.  Patient comfortable with plan for discharge.  Return precautions and treatment recommendations and follow-up discussed with the patient who is agreeable with the plan.  Husband here who will be driving her home ____________________________________________   FINAL CLINICAL IMPRESSION(S) / ED DIAGNOSES  Final diagnoses:  Lightheadedness  Dehydration        Note:  This document was prepared using Dragon voice recognition software and may include unintentional dictation errors       Delman Kitten, MD 01/20/19 1316

## 2019-01-20 NOTE — ED Notes (Signed)
Patient giving urine sample at this time

## 2019-04-18 ENCOUNTER — Other Ambulatory Visit: Payer: Self-pay | Admitting: Obstetrics & Gynecology

## 2019-04-18 DIAGNOSIS — Z1231 Encounter for screening mammogram for malignant neoplasm of breast: Secondary | ICD-10-CM

## 2019-04-29 DIAGNOSIS — D251 Intramural leiomyoma of uterus: Secondary | ICD-10-CM | POA: Insufficient documentation

## 2019-06-10 ENCOUNTER — Ambulatory Visit
Admission: RE | Admit: 2019-06-10 | Discharge: 2019-06-10 | Disposition: A | Discharge status: Home / Self Care | Payer: Managed Care, Other (non HMO) | Source: Ambulatory Visit | Attending: Obstetrics & Gynecology | Admitting: Obstetrics & Gynecology

## 2019-06-10 DIAGNOSIS — Z1231 Encounter for screening mammogram for malignant neoplasm of breast: Secondary | ICD-10-CM | POA: Insufficient documentation

## 2019-09-26 ENCOUNTER — Other Ambulatory Visit: Payer: Self-pay

## 2019-09-26 ENCOUNTER — Ambulatory Visit: Payer: Managed Care, Other (non HMO) | Attending: Internal Medicine

## 2019-09-26 DIAGNOSIS — Z23 Encounter for immunization: Secondary | ICD-10-CM

## 2019-09-26 NOTE — Progress Notes (Signed)
   Covid-19 Vaccination Clinic  Name:  Jasmine Buck    MRN: [id number] DOB: 1961-02-22  09/26/2019  [name] was observed post Covid-19 immunization for 15 minutes without incident. She was provided with Vaccine Information Sheet and instruction to access the V-Safe system.   [name] was instructed to call 911 with any severe reactions post vaccine: Marland Kitchen Difficulty breathing  . Swelling of face and throat  . A fast heartbeat  . A bad rash all over body  . Dizziness and weakness   Immunizations Administered    Name Date Dose VIS Date Route   Pfizer COVID-19 Vaccine 09/26/2019 10:50 AM 0.3 mL 06/28/2019 Intramuscular   Manufacturer: [place]   Lot: [id number]   [place]: [id number]

## 2019-10-23 ENCOUNTER — Ambulatory Visit: Payer: Managed Care, Other (non HMO) | Attending: Internal Medicine

## 2019-10-23 DIAGNOSIS — Z23 Encounter for immunization: Secondary | ICD-10-CM

## 2019-10-23 NOTE — Progress Notes (Signed)
   Covid-19 Vaccination Clinic  Name:  Jasmine Buck    MRN: [id number] DOB: 22-Dec-1960  10/23/2019  [name] was observed post Covid-19 immunization for 15 minutes without incident. She was provided with Vaccine Information Sheet and instruction to access the V-Safe system.   [name] was instructed to call 911 with any severe reactions post vaccine: Marland Kitchen Difficulty breathing  . Swelling of face and throat  . A fast heartbeat  . A bad rash all over body  . Dizziness and weakness   Immunizations Administered    Name Date Dose VIS Date Route   Pfizer COVID-19 Vaccine 10/23/2019  9:00 AM 0.3 mL 06/28/2019 Intramuscular   Manufacturer: [place]   Lot: [id number]   [place]: [id number]

## 2020-05-15 ENCOUNTER — Other Ambulatory Visit: Payer: Self-pay | Admitting: Obstetrics & Gynecology

## 2020-05-26 ENCOUNTER — Other Ambulatory Visit: Payer: Self-pay | Admitting: Obstetrics & Gynecology

## 2020-05-26 DIAGNOSIS — Z1231 Encounter for screening mammogram for malignant neoplasm of breast: Secondary | ICD-10-CM

## 2020-07-03 ENCOUNTER — Ambulatory Visit
Admission: RE | Admit: 2020-07-03 | Discharge: 2020-07-03 | Disposition: A | Discharge status: Home / Self Care | Payer: Managed Care, Other (non HMO) | Source: Ambulatory Visit | Attending: Obstetrics & Gynecology | Admitting: Obstetrics & Gynecology

## 2020-07-03 ENCOUNTER — Other Ambulatory Visit: Payer: Self-pay

## 2020-07-03 DIAGNOSIS — Z1231 Encounter for screening mammogram for malignant neoplasm of breast: Secondary | ICD-10-CM | POA: Diagnosis present

## 2021-02-21 ENCOUNTER — Emergency Department
Admission: EM | Admit: 2021-02-21 | Discharge: 2021-02-21 | Disposition: A | Discharge status: Home / Self Care | Payer: Managed Care, Other (non HMO) | Attending: Emergency Medicine | Admitting: Emergency Medicine

## 2021-02-21 ENCOUNTER — Emergency Department: Payer: Managed Care, Other (non HMO)

## 2021-02-21 ENCOUNTER — Encounter: Payer: Self-pay | Admitting: Emergency Medicine

## 2021-02-21 ENCOUNTER — Other Ambulatory Visit: Payer: Self-pay

## 2021-02-21 DIAGNOSIS — I10 Essential (primary) hypertension: Secondary | ICD-10-CM | POA: Diagnosis not present

## 2021-02-21 DIAGNOSIS — Z79899 Other long term (current) drug therapy: Secondary | ICD-10-CM | POA: Diagnosis not present

## 2021-02-21 DIAGNOSIS — R42 Dizziness and giddiness: Secondary | ICD-10-CM | POA: Diagnosis not present

## 2021-02-21 DIAGNOSIS — R000 Tachycardia, unspecified: Secondary | ICD-10-CM | POA: Insufficient documentation

## 2021-02-21 DIAGNOSIS — R002 Palpitations: Secondary | ICD-10-CM

## 2021-02-21 LAB — TROPONIN I (HIGH SENSITIVITY)
Troponin I (High Sensitivity): 5 ng/L (ref <18)
Troponin I (High Sensitivity): 12 ng/L (ref <18)
Troponin I (High Sensitivity): 12 ng/L (ref <18)

## 2021-02-21 LAB — CBC
HCT: 37.3 % (ref 36.0–46.0)
Hemoglobin: 12.6 g/dL (ref 12.0–15.0)
MCH: 29.7 pg (ref 26.0–34.0)
MCHC: 33.8 g/dL (ref 30.0–36.0)
MCV: 88 fL (ref 80.0–100.0)
Platelets: 393 10*3/uL (ref 150–400)
RBC: 4.24 MIL/uL (ref 3.87–5.11)
RDW: 13.2 % (ref 11.5–15.5)
WBC: 11.4 10*3/uL — ABNORMAL HIGH (ref 4.0–10.5)
nRBC: 0 % (ref 0.0–0.2)

## 2021-02-21 LAB — BASIC METABOLIC PANEL
Anion gap: 9 (ref 5–15)
BUN: 15 mg/dL (ref 6–20)
CO2: 23 mmol/L (ref 22–32)
Calcium: 9.4 mg/dL (ref 8.9–10.3)
Chloride: 104 mmol/L (ref 98–111)
Creatinine, Ser: 0.96 mg/dL (ref 0.44–1.00)
GFR, Estimated: >60 mL/min (ref >60)
Glucose, Bld: 127 mg/dL — ABNORMAL HIGH (ref 70–99)
Potassium: 3.4 mmol/L — ABNORMAL LOW (ref 3.5–5.1)
Sodium: 136 mmol/L (ref 135–145)

## 2021-02-21 NOTE — Discharge Instructions (Addendum)
Please follow-up with your cardiologist to schedule an appointment as you may need to have prolonged monitoring to determine whether your palpitations are from an abnormal heart rate.

## 2021-02-21 NOTE — ED Triage Notes (Signed)
Pt reports this am her heart started racing. Pt reports hx of the same but can usually slow it down on her own. Pt denies pain, sob, nausea or other sx's.

## 2021-02-21 NOTE — ED Provider Notes (Signed)
[facility]  ____________________________________________   Event Date/Time   First MD Initiated Contact with Patient 02/21/21 1400     (approximate)  I have reviewed the triage vital signs and the nursing notes.   HISTORY  Chief Complaint Tachycardia    HPI Jasmine Buck is a 60 y.o. female with past medical history of hypertension who presents with palpitations.  Patient was in her normal state of health, taking a bath when she had an episode of palpitations and lightheadedness.  She did not syncopized.  She not having chest pain or associated shortness of breath.  The episode lasted several minutes and then resolved.  The dentist she got to the ED she felt mostly back to normal and currently is asymptomatic.  Denies any nausea vomiting abdominal pain fevers or chills.  She has had similar episodes of palpitations in the past but has not typically felt lightheaded with them.          Past Medical History:  Diagnosis Date   Hypertension     There are no problems to display for this patient.   History reviewed. No pertinent surgical history.  Prior to Admission medications   Medication Sig Start Date End Date Taking? Authorizing Provider  amLODipine-benazepril (LOTREL) 5-10 MG capsule Take 1 capsule by mouth daily.    [provider]  metoprolol succinate (TOPROL-XL) 50 MG 24 hr tablet Take 50 mg by mouth daily. Take with or immediately following a meal.    [provider]    Allergies Ace inhibitors  No family history on file.  Social History Social History   Tobacco Use   Smoking status: Never   Smokeless tobacco: Never  Substance Use Topics   Alcohol use: No   Drug use: No    Review of Systems   Review of Systems  Constitutional:  Negative for chills and fever.  Respiratory:  Negative for shortness of breath.   Cardiovascular:  Positive for palpitations. Negative for chest pain.  Gastrointestinal:   Negative for nausea and vomiting.  Neurological:  Positive for light-headedness. Negative for weakness, numbness and headaches.   Physical Exam Updated Vital Signs BP 139/84 (BP Location: Left Arm)   Pulse 77   Temp 98.1 F (36.7 C) (Oral)   Resp 16   Ht '5\' 2"'$  (1.575 m)   Wt 85.7 kg   SpO2 98%   BMI 34.56 kg/m   Physical Exam Vitals and nursing note reviewed.  Constitutional:      General: She is not in acute distress.    Appearance: Normal appearance. She is not toxic-appearing.  HENT:     Head: Normocephalic and atraumatic.     Nose: Nose normal. No congestion.     Mouth/Throat:     Mouth: Mucous membranes are moist.  Eyes:     General: No scleral icterus.    Conjunctiva/sclera: Conjunctivae normal.  Cardiovascular:     Rate and Rhythm: Normal rate and regular rhythm.  Pulmonary:     Effort: Pulmonary effort is normal. No respiratory distress.     Breath sounds: Normal breath sounds. No wheezing.  Abdominal:     Palpations: Abdomen is soft.     Tenderness: There is no abdominal tenderness. There is no guarding.  Musculoskeletal:        General: No swelling, tenderness, deformity or signs of injury. Normal range of motion.     Cervical back: Neck supple. No rigidity.  Skin:    General: Skin is  warm and dry.     Coloration: Skin is not jaundiced.  Neurological:     General: No focal deficit present.     Mental Status: She is alert and oriented to person, place, and time.  Psychiatric:        Mood and Affect: Mood normal.        Behavior: Behavior normal.     LABS (all labs ordered are listed, but only abnormal results are displayed)  Labs Reviewed  BASIC METABOLIC PANEL - Abnormal; Notable for the following components:      Result Value   Potassium 3.4 (*)    Glucose, Bld 127 (*)    All other components within normal limits  CBC - Abnormal; Notable for the following components:   WBC 11.4 (*)    All other components within normal limits  POC URINE PREG,  ED  TROPONIN I (HIGH SENSITIVITY)  TROPONIN I (HIGH SENSITIVITY)  TROPONIN I (HIGH SENSITIVITY)   ____________________________________________  EKG  Sinus tachycardia, normal axis, normal intervals, no signs of ischemia ____________________________________________  RADIOLOGY I, Madelin Headings, personally viewed and evaluated these images (plain radiographs) as part of my medical decision making, as well as reviewing the written report by the radiologist.  ED MD interpretation:  n/a    ____________________________________________   PROCEDURES  Procedure(s) performed (including Critical Care):  Procedures   ____________________________________________   INITIAL IMPRESSION / ASSESSMENT AND PLAN / ED COURSE     60 year old female who presents after an episode of palpitations and lightheadedness.  Her EKG here is sinus tachycardia without concerning features.  She appears well normal physical exam.  Her labs are reassuring, initial troponin is 5 but on repeat is 12.  Overall my suspicion for ventricular tachycardia or malignant arrhythmia is low however with this increase in troponin will obtain another delta troponin at 2 hours to further stratify.  We discussed follow-up with her cardiologist as she would likely benefit from a Holter monitor will be recorder when she has had multiple episodes in the past.  Patient signed out pending third troponin.      ____________________________________________   FINAL CLINICAL IMPRESSION(S) / ED DIAGNOSES  Final diagnoses:  Palpitations     ED Discharge Orders     None        Note:  This document was prepared using Dragon voice recognition software and may include unintentional dictation errors.    Rada Hay, MD 02/21/21 [phone]

## 2021-02-21 NOTE — ED Provider Notes (Signed)
Emergency department handoff note  Care of this patient was signed out to me at the end of the previous provider shift.  All pertinent patient information was conveyed and all questions were answered.  Patient pending a third troponin given a slight increase from 5-12 on previous.  Patient's troponin remained stable at 12 without any significant worsening of her symptoms.  The patient has been reexamined and is ready to be discharged.  All diagnostic results have been reviewed and discussed with the patient/family.  Care plan has been outlined and the patient/family understands all current diagnoses, results, and treatment plans.  There are no new complaints, changes, or physical findings at this time.  All questions have been addressed and answered.  Patient was instructed to, and agrees to follow-up with their primary care physician as well as return to the emergency department if any new or worsening symptoms develop.   Naaman Plummer, MD 02/21/21 [phone]

## 2021-06-03 ENCOUNTER — Ambulatory Visit: Payer: Managed Care, Other (non HMO) | Admitting: Obstetrics and Gynecology

## 2021-06-03 ENCOUNTER — Encounter: Payer: Self-pay | Admitting: Obstetrics and Gynecology

## 2021-06-03 ENCOUNTER — Other Ambulatory Visit: Payer: Self-pay

## 2021-06-03 VITALS — BP 145/86 | HR 95 | Ht 62.0 in | Wt 212.4 lb

## 2021-06-03 DIAGNOSIS — Z1231 Encounter for screening mammogram for malignant neoplasm of breast: Secondary | ICD-10-CM

## 2021-06-03 DIAGNOSIS — I10 Essential (primary) hypertension: Secondary | ICD-10-CM | POA: Diagnosis not present

## 2021-06-03 DIAGNOSIS — E669 Obesity, unspecified: Secondary | ICD-10-CM

## 2021-06-03 DIAGNOSIS — Z01419 Encounter for gynecological examination (general) (routine) without abnormal findings: Secondary | ICD-10-CM | POA: Diagnosis not present

## 2021-06-03 DIAGNOSIS — Z6838 Body mass index (BMI) 38.0-38.9, adult: Secondary | ICD-10-CM

## 2021-06-03 DIAGNOSIS — R7303 Prediabetes: Secondary | ICD-10-CM

## 2021-06-03 NOTE — Patient Instructions (Signed)
Breast Self-Awareness Breast self-awareness is knowing how your breasts look and feel. Doing breast self-awareness is important. It allows you to catch a breast problem early while it is still small and can be treated. All women should do breast self-awareness, including women who have had breast implants. Tell your doctor if you notice a change in your breasts. What you need: A mirror. A well-lit room. How to do a breast self-exam A breast self-exam is one way to learn what is normal for your breasts and to check for changes. To do a breast self-exam: Look for changes  Take off all the clothes above your waist. Stand in front of a mirror in a room with good lighting. Put your hands on your hips. Push your hands down. Look at your breasts and nipples in the mirror to see if one breast or nipple looks different from the other. Check to see if: The shape of one breast is different. The size of one breast is different. There are wrinkles, dips, and bumps in one breast and not the other. Look at each breast for changes in the skin, such as: Redness. Scaly areas. Look for changes in your nipples, such as: Liquid around the nipples. Bleeding. Dimpling. Redness. A change in where the nipples are. Feel for changes  Lie on your back on the floor. Feel each breast. To do this, follow these steps: Pick a breast to feel. Put the arm closest to that breast above your head. Use your other arm to feel the nipple area of your breast. Feel the area with the pads of your three middle fingers by making small circles with your fingers. For the first circle, press lightly. For the second circle, press harder. For the third circle, press even harder. Keep making circles with your fingers at the different pressures as you move down your breast. Stop when you feel your ribs. Move your fingers a little toward the center of your body. Start making circles with your fingers again, this time going up until  you reach your collarbone. Keep making up-and-down circles until you reach your armpit. Remember to keep using the three pressures. Feel the other breast in the same way. Sit or stand in the tub or shower. With soapy water on your skin, feel each breast the same way you did in step 2 when you were lying on the floor. Write down what you find Writing down what you find can help you remember what to tell your doctor. Write down: What is normal for each breast. Any changes you find in each breast, including: The kind of changes you find. Whether you have pain. Size and location of any lumps. When you last had your menstrual period. General tips Check your breasts every month. If you are breastfeeding, the best time to check your breasts is after you feed your baby or after you use a breast pump. If you get menstrual periods, the best time to check your breasts is 5-7 days after your menstrual period is over. With time, you will become comfortable with the self-exam, and you will begin to know if there are changes in your breasts. Contact a doctor if you: See a change in the shape or size of your breasts or nipples. See a change in the skin of your breast or nipples, such as red or scaly skin. Have fluid coming from your nipples that is not normal. Find a lump or thick area that was not there before. Have pain in   your breasts. °Have any concerns about your breast health. °Summary °Breast self-awareness includes looking for changes in your breasts, as well as feeling for changes within your breasts. °Breast self-awareness should be done in front of a mirror in a well-lit room. °You should check your breasts every month. If you get menstrual periods, the best time to check your breasts is 5-7 days after your menstrual period is over. °Let your doctor know of any changes you see in your breasts, including changes in size, changes on the skin, pain or tenderness, or fluid from your nipples that is not  normal. °This information is not intended to replace advice given to you by your health care provider. Make sure you discuss any questions you have with your health care provider. °Document Revised: 02/20/2018 Document Reviewed: 02/20/2018 °Elsevier Patient Education © 2022 [employer] °Preventive Care 40-64 Years Old, Female °Preventive care refers to lifestyle choices and visits with your health care provider that can promote health and wellness. Preventive care visits are also called wellness exams. °What can I expect for my preventive care visit? °Counseling °Your health care provider may ask you questions about your: °Medical history, including: °Past medical problems. °Family medical history. °Pregnancy history. °Current health, including: °Menstrual cycle. °Method of birth control. °Emotional well-being. °Home life and relationship well-being. °Sexual activity and sexual health. °Lifestyle, including: °Alcohol, nicotine or tobacco, and drug use. °Access to firearms. °Diet, exercise, and sleep habits. °Work and work environment. °Sunscreen use. °Safety issues such as seatbelt and bike helmet use. °Physical exam °Your health care provider will check your: °Height and weight. These may be used to calculate your BMI (body mass index). BMI is a measurement that tells if you are at a healthy weight. °Waist circumference. This measures the distance around your waistline. This measurement also tells if you are at a healthy weight and may help predict your risk of certain diseases, such as type 2 diabetes and high blood pressure. °Heart rate and blood pressure. °Body temperature. °Skin for abnormal spots. °What immunizations do I need? °Vaccines are usually given at various ages, according to a schedule. Your health care provider will recommend vaccines for you based on your age, medical history, and lifestyle or other factors, such as travel or where you work. °What tests do I need? °Screening °Your health care  provider may recommend screening tests for certain conditions. This may include: °Lipid and cholesterol levels. °Diabetes screening. This is done by checking your blood sugar (glucose) after you have not eaten for a while (fasting). °Pelvic exam and Pap test. °Hepatitis B test. °Hepatitis C test. °HIV (human immunodeficiency virus) test. °STI (sexually transmitted infection) testing, if you are at risk. °Lung cancer screening. °Colorectal cancer screening. °Mammogram. Talk with your health care provider about when you should start having regular mammograms. This may depend on whether you have a family history of breast cancer. °BRCA-related cancer screening. This may be done if you have a family history of breast, ovarian, tubal, or peritoneal cancers. °Bone density scan. This is done to screen for osteoporosis. °Talk with your health care provider about your test results, treatment options, and if necessary, the need for more tests. °Follow these instructions at home: °Eating and drinking ° °Eat a diet that includes fresh fruits and vegetables, whole grains, lean protein, and low-fat dairy products. °Take vitamin and mineral supplements as recommended by your health care provider. °Do not drink alcohol if: °Your health care provider tells you not to drink. °You are pregnant,   may be pregnant, or are planning to become pregnant. °If you drink alcohol: °Limit how much you have to 0-1 drink a day. °Know how much alcohol is in your drink. In the [place] one drink equals one 12 oz bottle of beer (355 mL), one 5 oz glass of wine (148 mL), or one 1½ oz glass of hard liquor (44 mL). °Lifestyle °Brush your teeth every morning and night with fluoride toothpaste. Floss one time each day. °Exercise for at least 30 minutes 5 or more days each week. °Do not use any products that contain nicotine or tobacco. These products include cigarettes, chewing tobacco, and vaping devices, such as e-cigarettes. If you need help quitting, ask  your health care provider. °Do not use drugs. °If you are sexually active, practice safe sex. Use a condom or other form of protection to prevent STIs. °If you do not wish to become pregnant, use a form of birth control. If you plan to become pregnant, see your health care provider for a prepregnancy visit. °Take aspirin only as told by your health care provider. Make sure that you understand how much to take and what form to take. Work with your health care provider to find out whether it is safe and beneficial for you to take aspirin daily. °Find healthy ways to manage stress, such as: °Meditation, yoga, or listening to music. °Journaling. °Talking to a trusted person. °Spending time with friends and family. °Minimize exposure to UV radiation to reduce your risk of skin cancer. °Safety °Always wear your seat belt while driving or riding in a vehicle. °Do not drive: °If you have been drinking alcohol. Do not ride with someone who has been drinking. °When you are tired or distracted. °While texting. °If you have been using any mind-altering substances or drugs. °Wear a helmet and other protective equipment during sports activities. °If you have firearms in your house, make sure you follow all gun safety procedures. °Seek help if you have been physically or sexually abused. °What's next? °Visit your health care provider once a year for an annual wellness visit. °Ask your health care provider how often you should have your eyes and teeth checked. °Stay up to date on all vaccines. °This information is not intended to replace advice given to you by your health care provider. Make sure you discuss any questions you have with your health care provider. °Document Revised: 12/30/2020 Document Reviewed: 12/30/2020 °Elsevier Patient Education © 2022 Else[employer] Inc. ° °
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
## 2021-06-04 ENCOUNTER — Encounter: Payer: Managed Care, Other (non HMO) | Admitting: Obstetrics and Gynecology

## 2021-06-17 ENCOUNTER — Encounter: Payer: Managed Care, Other (non HMO) | Admitting: Obstetrics and Gynecology

## 2021-06-24 ENCOUNTER — Encounter: Payer: Managed Care, Other (non HMO) | Admitting: Obstetrics and Gynecology

## 2021-06-24 ENCOUNTER — Other Ambulatory Visit: Payer: Managed Care, Other (non HMO)

## 2021-08-24 ENCOUNTER — Other Ambulatory Visit: Payer: Self-pay

## 2021-08-24 ENCOUNTER — Ambulatory Visit
Admission: RE | Admit: 2021-08-24 | Discharge: 2021-08-24 | Disposition: A | Discharge status: Home / Self Care | Payer: Managed Care, Other (non HMO) | Source: Ambulatory Visit | Attending: Obstetrics and Gynecology | Admitting: Obstetrics and Gynecology

## 2021-08-24 DIAGNOSIS — Z1231 Encounter for screening mammogram for malignant neoplasm of breast: Secondary | ICD-10-CM | POA: Diagnosis present

## 2021-08-25 ENCOUNTER — Other Ambulatory Visit: Payer: Self-pay | Admitting: Obstetrics and Gynecology

## 2022-07-01 ENCOUNTER — Ambulatory Visit (INDEPENDENT_AMBULATORY_CARE_PROVIDER_SITE_OTHER): Payer: Managed Care, Other (non HMO) | Admitting: Obstetrics and Gynecology

## 2022-07-01 ENCOUNTER — Encounter: Payer: Self-pay | Admitting: Obstetrics and Gynecology

## 2022-07-01 VITALS — BP 114/71 | HR 93 | Resp 16 | Ht 62.0 in | Wt 211.5 lb

## 2022-07-01 DIAGNOSIS — Z01419 Encounter for gynecological examination (general) (routine) without abnormal findings: Secondary | ICD-10-CM | POA: Diagnosis not present

## 2022-07-01 DIAGNOSIS — R7303 Prediabetes: Secondary | ICD-10-CM

## 2022-07-01 DIAGNOSIS — Z1231 Encounter for screening mammogram for malignant neoplasm of breast: Secondary | ICD-10-CM

## 2022-07-01 DIAGNOSIS — I10 Essential (primary) hypertension: Secondary | ICD-10-CM

## 2022-07-01 DIAGNOSIS — Z780 Asymptomatic menopausal state: Secondary | ICD-10-CM | POA: Diagnosis not present

## 2022-07-01 DIAGNOSIS — Z6838 Body mass index (BMI) 38.0-38.9, adult: Secondary | ICD-10-CM | POA: Diagnosis not present

## 2022-07-01 DIAGNOSIS — E669 Obesity, unspecified: Secondary | ICD-10-CM | POA: Diagnosis not present

## 2022-07-01 NOTE — Patient Instructions (Signed)
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
## 2022-08-26 ENCOUNTER — Ambulatory Visit
Admission: RE | Admit: 2022-08-26 | Discharge: 2022-08-26 | Disposition: A | Discharge status: Home / Self Care | Payer: Managed Care, Other (non HMO) | Source: Ambulatory Visit | Attending: Obstetrics and Gynecology | Admitting: Obstetrics and Gynecology

## 2022-08-26 DIAGNOSIS — Z01419 Encounter for gynecological examination (general) (routine) without abnormal findings: Secondary | ICD-10-CM | POA: Insufficient documentation

## 2022-08-26 DIAGNOSIS — Z1231 Encounter for screening mammogram for malignant neoplasm of breast: Secondary | ICD-10-CM

## 2022-09-12 ENCOUNTER — Other Ambulatory Visit: Payer: Self-pay | Admitting: Cardiovascular Disease

## 2022-09-26 ENCOUNTER — Ambulatory Visit (INDEPENDENT_AMBULATORY_CARE_PROVIDER_SITE_OTHER): Payer: Managed Care, Other (non HMO) | Admitting: Internal Medicine

## 2022-09-26 ENCOUNTER — Encounter: Payer: Self-pay | Admitting: Internal Medicine

## 2022-09-26 VITALS — BP 130/78 | HR 97 | Ht 62.0 in | Wt 209.6 lb

## 2022-09-26 DIAGNOSIS — I10 Essential (primary) hypertension: Secondary | ICD-10-CM | POA: Diagnosis not present

## 2022-09-26 DIAGNOSIS — K219 Gastro-esophageal reflux disease without esophagitis: Secondary | ICD-10-CM | POA: Insufficient documentation

## 2022-09-26 DIAGNOSIS — R1010 Upper abdominal pain, unspecified: Secondary | ICD-10-CM | POA: Diagnosis not present

## 2022-09-26 DIAGNOSIS — R7303 Prediabetes: Secondary | ICD-10-CM

## 2022-09-26 DIAGNOSIS — E782 Mixed hyperlipidemia: Secondary | ICD-10-CM | POA: Diagnosis not present

## 2022-09-26 DIAGNOSIS — K439 Ventral hernia without obstruction or gangrene: Secondary | ICD-10-CM | POA: Insufficient documentation

## 2022-09-26 MED ORDER — PANTOPRAZOLE SODIUM 40 MG PO TBEC: 40.0000 mg | DELAYED_RELEASE_TABLET | Freq: Every day | ORAL | 2 refills | Status: DC

## 2022-09-26 NOTE — Progress Notes (Signed)
Established Patient Office Visit  Subjective:  Patient ID: Jasmine Buck, female    DOB: 09-Apr-1961  Age: 62 y.o. MRN: [id number]  Chief Complaint  Patient presents with   Follow-up    4 month follow up    Patient comes in for her follow-up today.  She has gained some more weight and would like to discuss weight loss options.  But she is also concerned about abdominal pain distention, excessive belching, and bloating. On physical with she does have a significant ventral hernia this is new.  Will schedule a CT scan of the abdomen.  Check labs today.  Resume pantoprazole.  Will discuss Ozempic at next follow-up.  Was at her OB/GYN's office and they noted that she has ventral hernia.  Currently she is not taking her pantoprazole that she ran out. On     Past Medical History:  Diagnosis Date   Fibroid    GERD (gastroesophageal reflux disease)    History of COVID-19    Hypertension    Mixed hyperlipidemia    Prediabetes     Past Surgical History:  Procedure Laterality Date   CESAREAN SECTION      Social History   Socioeconomic History   Marital status: Single    Spouse name: Not on file   Number of children: Not on file   Years of education: Not on file   Highest education level: Not on file  Occupational History   Not on file  Tobacco Use   Smoking status: Never   Smokeless tobacco: Never  Vaping Use   Vaping Use: Never used  Substance and Sexual Activity   Alcohol use: No   Drug use: No   Sexual activity: Yes  Other Topics Concern   Not on file  Social History Narrative   Not on file   Social Determinants of Health   Financial Resource Strain: Not on file  Food Insecurity: Not on file  Transportation Needs: Not on file  Physical Activity: Not on file  Stress: Not on file  Social Connections: Not on file  Intimate Partner Violence: Not on file    Family History  Problem Relation Age of Onset   Hypertension Mother    Hypertension Father      Allergies  Allergen Reactions   Ace Inhibitors Swelling    Review of Systems  Constitutional:  Negative for chills, diaphoresis, fever, malaise/fatigue and weight loss.  HENT: Negative.    Respiratory: Negative.    Cardiovascular: Negative.   Gastrointestinal:  Positive for abdominal pain, heartburn and nausea. Negative for blood in stool, constipation, diarrhea, melena and vomiting.  Genitourinary: Negative.   Musculoskeletal: Negative.   Skin: Negative.   Neurological: Negative.   Psychiatric/Behavioral: Negative.         Objective:   BP 130/78   Pulse 97   Ht '5\' 2"'$  (1.575 m)   Wt 209 lb 9.6 oz (95.1 kg)   SpO2 96%   BMI 38.34 kg/m   Vitals:   09/26/22 1458  BP: 130/78  Pulse: 97  Height: '5\' 2"'$  (1.575 m)  Weight: 209 lb 9.6 oz (95.1 kg)  SpO2: 96%  BMI (Calculated): 38.33    Physical Exam Vitals and nursing note reviewed.  Constitutional:      Appearance: Normal appearance. She is obese.  Cardiovascular:     Rate and Rhythm: Normal rate and regular rhythm.     Pulses: Normal pulses.     Heart sounds: Normal heart sounds.  Pulmonary:  Effort: Pulmonary effort is normal.     Breath sounds: Normal breath sounds.  Abdominal:     General: Abdomen is flat. Bowel sounds are normal. There is distension.     Palpations: Abdomen is soft. There is no mass.     Tenderness: There is abdominal tenderness. There is no guarding or rebound.     Hernia: A hernia is present.  Musculoskeletal:        General: Normal range of motion.     Cervical back: Normal range of motion and neck supple.  Skin:    General: Skin is warm and dry.  Neurological:     General: No focal deficit present.     Mental Status: She is alert.  Psychiatric:        Mood and Affect: Mood normal.        Behavior: Behavior normal.      No results found for any visits on 09/26/22.  No results found for this or any previous visit (from the past 2160 hour(s)).    Assessment & Plan:   Schedule CT abdomen.  Check labs. Start pantoprazole. Problem List Items Addressed This Visit     Hypertension   Relevant Medications   atorvastatin (LIPITOR) 10 MG tablet   Other Relevant Orders   CMP14+EGFR   Pre-diabetes   Relevant Orders   Hemoglobin A1c   Pain of upper abdomen - Primary   Relevant Orders   CT ABDOMEN PELVIS W WO CONTRAST   Lipase   CBC With Differential   Gastroesophageal reflux disease without esophagitis   Relevant Medications   pantoprazole (PROTONIX) 40 MG tablet   Other Relevant Orders   CBC With Differential   Mixed hyperlipidemia   Relevant Medications   atorvastatin (LIPITOR) 10 MG tablet   Other Relevant Orders   Lipid Panel w/o Chol/HDL Ratio    Return in about 10 days (around 10/06/2022).   Total time spent: 30 minutes  Perrin Maltese, MD  09/26/2022

## 2022-09-27 LAB — CMP14+EGFR
ALT: 20 IU/L (ref 0–32)
AST: 21 IU/L (ref 0–40)
Albumin/Globulin Ratio: 1.3 (ref 1.2–2.2)
Albumin: 4.6 g/dL (ref 3.8–4.9)
Alkaline Phosphatase: 132 IU/L — ABNORMAL HIGH (ref 44–121)
BUN/Creatinine Ratio: 15 (ref 9–23)
BUN: 13 mg/dL (ref 6–24)
Bilirubin Total: 0.3 mg/dL (ref 0.0–1.2)
CO2: 19 mmol/L — ABNORMAL LOW (ref 20–29)
Calcium: 9.9 mg/dL (ref 8.7–10.2)
Chloride: 102 mmol/L (ref 96–106)
Creatinine, Ser: 0.86 mg/dL (ref 0.57–1.00)
Globulin, Total: 3.5 g/dL (ref 1.5–4.5)
Glucose: 103 mg/dL — ABNORMAL HIGH (ref 70–99)
Potassium: 4.3 mmol/L (ref 3.5–5.2)
Sodium: 139 mmol/L (ref 134–144)
Total Protein: 8.1 g/dL (ref 6.0–8.5)
eGFR: 78 mL/min/{1.73_m2} (ref >59)

## 2022-09-27 LAB — CBC WITH DIFFERENTIAL
Basophils Absolute: 0 10*3/uL (ref 0.0–0.2)
Basos: 0 %
EOS (ABSOLUTE): 0.1 10*3/uL (ref 0.0–0.4)
Eos: 1 %
Hematocrit: 35.4 % (ref 34.0–46.6)
Hemoglobin: 12.2 g/dL (ref 11.1–15.9)
Immature Grans (Abs): 0 10*3/uL (ref 0.0–0.1)
Immature Granulocytes: 0 %
Lymphocytes Absolute: 4.6 10*3/uL — ABNORMAL HIGH (ref 0.7–3.1)
Lymphs: 46 %
MCH: 29 pg (ref 26.6–33.0)
MCHC: 34.5 g/dL (ref 31.5–35.7)
MCV: 84 fL (ref 79–97)
Monocytes Absolute: 0.7 10*3/uL (ref 0.1–0.9)
Monocytes: 7 %
Neutrophils Absolute: 4.6 10*3/uL (ref 1.4–7.0)
Neutrophils: 46 %
RBC: 4.2 x10E6/uL (ref 3.77–5.28)
RDW: 12.8 % (ref 11.7–15.4)
WBC: 10 10*3/uL (ref 3.4–10.8)

## 2022-09-27 LAB — LIPASE: Lipase: 25 U/L (ref 14–72)

## 2022-09-27 LAB — LIPID PANEL W/O CHOL/HDL RATIO
Cholesterol, Total: 209 mg/dL — ABNORMAL HIGH (ref 100–199)
HDL: 72 mg/dL (ref >39)
LDL Chol Calc (NIH): 126 mg/dL — ABNORMAL HIGH (ref 0–99)
Triglycerides: 62 mg/dL (ref 0–149)
VLDL Cholesterol Cal: 11 mg/dL (ref 5–40)

## 2022-09-27 LAB — HEMOGLOBIN A1C
Est. average glucose Bld gHb Est-mCnc: 128 mg/dL
Hgb A1c MFr Bld: 6.1 % — ABNORMAL HIGH (ref 4.8–5.6)

## 2022-09-29 ENCOUNTER — Other Ambulatory Visit: Payer: Self-pay | Admitting: Internal Medicine

## 2022-09-29 DIAGNOSIS — K439 Ventral hernia without obstruction or gangrene: Secondary | ICD-10-CM

## 2022-09-29 DIAGNOSIS — R1010 Upper abdominal pain, unspecified: Secondary | ICD-10-CM

## 2022-10-03 ENCOUNTER — Other Ambulatory Visit: Payer: Managed Care, Other (non HMO)

## 2022-10-06 ENCOUNTER — Ambulatory Visit
Admission: RE | Admit: 2022-10-06 | Discharge: 2022-10-06 | Disposition: A | Discharge status: Home / Self Care | Payer: Managed Care, Other (non HMO) | Source: Ambulatory Visit | Attending: Internal Medicine | Admitting: Internal Medicine

## 2022-10-06 DIAGNOSIS — R1010 Upper abdominal pain, unspecified: Secondary | ICD-10-CM

## 2022-10-06 DIAGNOSIS — K439 Ventral hernia without obstruction or gangrene: Secondary | ICD-10-CM | POA: Diagnosis present

## 2022-10-07 ENCOUNTER — Ambulatory Visit: Payer: Managed Care, Other (non HMO) | Admitting: Internal Medicine

## 2022-10-07 VITALS — BP 132/74 | HR 100 | Ht 62.0 in | Wt 209.0 lb

## 2022-10-07 DIAGNOSIS — R7303 Prediabetes: Secondary | ICD-10-CM

## 2022-10-07 DIAGNOSIS — I10 Essential (primary) hypertension: Secondary | ICD-10-CM | POA: Diagnosis not present

## 2022-10-07 DIAGNOSIS — E782 Mixed hyperlipidemia: Secondary | ICD-10-CM | POA: Diagnosis not present

## 2022-10-07 DIAGNOSIS — Z6838 Body mass index (BMI) 38.0-38.9, adult: Secondary | ICD-10-CM

## 2022-10-07 DIAGNOSIS — E6609 Other obesity due to excess calories: Secondary | ICD-10-CM

## 2022-10-07 DIAGNOSIS — K219 Gastro-esophageal reflux disease without esophagitis: Secondary | ICD-10-CM | POA: Diagnosis not present

## 2022-10-07 MED ORDER — SEMAGLUTIDE-WEIGHT MANAGEMENT 0.25 MG/0.5ML ~~LOC~~ SOAJ: 0.2500 mg | SUBCUTANEOUS | 0 refills | Status: DC

## 2022-10-10 ENCOUNTER — Encounter: Payer: Self-pay | Admitting: Internal Medicine

## 2022-10-10 DIAGNOSIS — E6609 Other obesity due to excess calories: Secondary | ICD-10-CM | POA: Insufficient documentation

## 2022-10-10 NOTE — Progress Notes (Signed)
Established Patient Office Visit  Subjective:  Patient ID: Jasmine Buck, female    DOB: January 27, 1961  Age: 62 y.o. MRN: [id number]  Chief Complaint  Patient presents with   Follow-up    10 day follow up    Patient comes in for follow-up of her upper abdominal discomfort.  She was started on Protonix and today she says all her symptoms have resolved.  Her ultrasound of the abdomen showed multiple gallstones but no cholecystitis.  Patient today has no nausea vomiting no abdominal pain and no constipation or diarrhea.  Her lab work was also stable.  Patient wants to start Wegovy injections to help her lose weight as she is also a prediabetic. We will start at 2.5 mg/week    No other concerns at this time.   Past Medical History:  Diagnosis Date   Fibroid    GERD (gastroesophageal reflux disease)    History of COVID-19    Hypertension    Mixed hyperlipidemia    Prediabetes     Past Surgical History:  Procedure Laterality Date   CESAREAN SECTION      Social History   Socioeconomic History   Marital status: Single    Spouse name: Not on file   Number of children: Not on file   Years of education: Not on file   Highest education level: Not on file  Occupational History   Not on file  Tobacco Use   Smoking status: Never   Smokeless tobacco: Never  Vaping Use   Vaping Use: Never used  Substance and Sexual Activity   Alcohol use: No   Drug use: No   Sexual activity: Yes  Other Topics Concern   Not on file  Social History Narrative   Not on file   Social Determinants of Health   Financial Resource Strain: Not on file  Food Insecurity: Not on file  Transportation Needs: Not on file  Physical Activity: Not on file  Stress: Not on file  Social Connections: Not on file  Intimate Partner Violence: Not on file    Family History  Problem Relation Age of Onset   Hypertension Mother    Hypertension Father     Allergies  Allergen Reactions   Ace Inhibitors  Swelling    Review of Systems  Constitutional: Negative.   HENT: Negative.    Eyes: Negative.   Respiratory: Negative.    Cardiovascular: Negative.   Gastrointestinal:  Negative for abdominal pain, blood in stool, constipation, diarrhea, heartburn, melena, nausea and vomiting.  Genitourinary: Negative.   Musculoskeletal: Negative.   Skin: Negative.   Neurological: Negative.   Psychiatric/Behavioral: Negative.         Objective:   BP 132/74   Pulse 100   Ht 5\' 2"  (1.575 m)   Wt 209 lb (94.8 kg)   SpO2 97%   BMI 38.23 kg/m   Vitals:   10/07/22 0931  BP: 132/74  Pulse: 100  Height: 5\' 2"  (1.575 m)  Weight: 209 lb (94.8 kg)  SpO2: 97%  BMI (Calculated): 38.22    Physical Exam Vitals and nursing note reviewed.  Constitutional:      Appearance: Normal appearance. She is obese.  HENT:     Nose: Nose normal.  Cardiovascular:     Rate and Rhythm: Normal rate and regular rhythm.  Pulmonary:     Effort: Pulmonary effort is normal.  Abdominal:     General: Abdomen is flat. Bowel sounds are normal.  Palpations: Abdomen is soft.  Musculoskeletal:        General: Normal range of motion.     Cervical back: Normal range of motion and neck supple.  Neurological:     General: No focal deficit present.     Mental Status: She is alert and oriented to person, place, and time.      No results found for any visits on 10/07/22.  Recent Results (from the past 2160 hour(s))  CMP14+EGFR     Status: Abnormal   Collection Time: 09/26/22  3:29 PM  Result Value Ref Range   Glucose 103 (H) 70 - 99 mg/dL   BUN 13 6 - 24 mg/dL   Creatinine, Ser 0.86 0.57 - 1.00 mg/dL   eGFR 78 >59 mL/min/1.73   BUN/Creatinine Ratio 15 9 - 23   Sodium 139 134 - 144 mmol/L   Potassium 4.3 3.5 - 5.2 mmol/L   Chloride 102 96 - 106 mmol/L   CO2 19 (L) 20 - 29 mmol/L   Calcium 9.9 8.7 - 10.2 mg/dL   Total Protein 8.1 6.0 - 8.5 g/dL   Albumin 4.6 3.8 - 4.9 g/dL   Globulin, Total 3.5 1.5 - 4.5  g/dL   Albumin/Globulin Ratio 1.3 1.2 - 2.2   Bilirubin Total 0.3 0.0 - 1.2 mg/dL   Alkaline Phosphatase 132 (H) 44 - 121 IU/L   AST 21 0 - 40 IU/L   ALT 20 0 - 32 IU/L  Lipase     Status: None   Collection Time: 09/26/22  3:29 PM  Result Value Ref Range   Lipase 25 14 - 72 U/L  Hemoglobin A1c     Status: Abnormal   Collection Time: 09/26/22  3:29 PM  Result Value Ref Range   Hgb A1c MFr Bld 6.1 (H) 4.8 - 5.6 %    Comment:          Prediabetes: 5.7 - 6.4          Diabetes: >6.4          Glycemic control for adults with diabetes: <7.0    Est. average glucose Bld gHb Est-mCnc 128 mg/dL  CBC With Differential     Status: Abnormal   Collection Time: 09/26/22  3:29 PM  Result Value Ref Range   WBC 10.0 3.4 - 10.8 x10E3/uL   RBC 4.20 3.77 - 5.28 x10E6/uL   Hemoglobin 12.2 11.1 - 15.9 g/dL   Hematocrit 35.4 34.0 - 46.6 %   MCV 84 79 - 97 fL   MCH 29.0 26.6 - 33.0 pg   MCHC 34.5 31.5 - 35.7 g/dL   RDW 12.8 11.7 - 15.4 %   Neutrophils 46 Not Estab. %   Lymphs 46 Not Estab. %   Monocytes 7 Not Estab. %   Eos 1 Not Estab. %   Basos 0 Not Estab. %   Neutrophils Absolute 4.6 1.4 - 7.0 x10E3/uL   Lymphocytes Absolute 4.6 (H) 0.7 - 3.1 x10E3/uL   Monocytes Absolute 0.7 0.1 - 0.9 x10E3/uL   EOS (ABSOLUTE) 0.1 0.0 - 0.4 x10E3/uL   Basophils Absolute 0.0 0.0 - 0.2 x10E3/uL   Immature Granulocytes 0 Not Estab. %   Immature Grans (Abs) 0.0 0.0 - 0.1 x10E3/uL  Lipid Panel w/o Chol/HDL Ratio     Status: Abnormal   Collection Time: 09/26/22  3:29 PM  Result Value Ref Range   Cholesterol, Total 209 (H) 100 - 199 mg/dL   Triglycerides 62 0 - 149 mg/dL  HDL 72 >39 mg/dL   VLDL Cholesterol Cal 11 5 - 40 mg/dL   LDL Chol Calc (NIH) 126 (H) 0 - 99 mg/dL      Assessment & Plan:  Patient advised to continue taking Protonix for another 4 to 6 weeks.  She will then stop and see if her abdominal symptoms return.  Will consider EGD at that time.  Prescription sent for [facility] to start at 2.5  mg/week.  Will follow-up in 4 weeks to see the effectiveness of this medicine and then to further increase the dose if tolerated. Problem List Items Addressed This Visit     Essential hypertension, benign   Pre-diabetes - Primary   Relevant Medications   Semaglutide-Weight Management 0.25 MG/0.5ML SOAJ   Gastroesophageal reflux disease without esophagitis   Mixed hyperlipidemia   Class 2 obesity due to excess calories without serious comorbidity with body mass index (BMI) of 38.0 to 38.9 in adult   Relevant Medications   Semaglutide-Weight Management 0.25 MG/0.5ML SOAJ    Return in about 4 weeks (around 11/04/2022).   Total time spent: 30 minutes  Perrin Maltese, MD  10/07/2022

## 2022-11-04 ENCOUNTER — Encounter: Payer: Self-pay | Admitting: Internal Medicine

## 2022-11-04 ENCOUNTER — Ambulatory Visit (INDEPENDENT_AMBULATORY_CARE_PROVIDER_SITE_OTHER): Payer: Managed Care, Other (non HMO) | Admitting: Internal Medicine

## 2022-11-04 VITALS — BP 122/74 | HR 104 | Ht 62.0 in | Wt 206.0 lb

## 2022-11-04 DIAGNOSIS — I10 Essential (primary) hypertension: Secondary | ICD-10-CM

## 2022-11-04 DIAGNOSIS — Z6838 Body mass index (BMI) 38.0-38.9, adult: Secondary | ICD-10-CM

## 2022-11-04 DIAGNOSIS — R7303 Prediabetes: Secondary | ICD-10-CM | POA: Diagnosis not present

## 2022-11-04 DIAGNOSIS — E782 Mixed hyperlipidemia: Secondary | ICD-10-CM

## 2022-11-04 DIAGNOSIS — E6609 Other obesity due to excess calories: Secondary | ICD-10-CM | POA: Diagnosis not present

## 2022-11-04 MED ORDER — SEMAGLUTIDE-WEIGHT MANAGEMENT 0.5 MG/0.5ML ~~LOC~~ SOAJ: 0.5000 mg | SUBCUTANEOUS | 0 refills | Status: DC

## 2022-11-04 NOTE — Progress Notes (Addendum)
Established Patient Office Visit  Subjective:  Patient ID: Jasmine Buck, female    DOB: 16-Mar-1961  Age: 62 y.o. MRN: [id number]  Chief Complaint  Patient presents with   Follow-up    4 week follow up    Patient comes in for her 1 month follow-up.  She was started on Wegovy injections 0.25 mg/week.  Patient reports that she is tolerating it well.  She does not have any nausea or vomiting, no abdominal pain, no constipation or diarrhea.  She has lost a few pounds as well. Will increase the dose to 0.5 mg/week.  Patient also encouraged to continue strict diet and an exercise program along with that. No other complaints.    No other concerns at this time.   Past Medical History:  Diagnosis Date   Fibroid    GERD (gastroesophageal reflux disease)    History of COVID-19    Hypertension    Mixed hyperlipidemia    Prediabetes     Past Surgical History:  Procedure Laterality Date   CESAREAN SECTION      Social History   Socioeconomic History   Marital status: Single    Spouse name: Not on file   Number of children: Not on file   Years of education: Not on file   Highest education level: Not on file  Occupational History   Not on file  Tobacco Use   Smoking status: Never   Smokeless tobacco: Never  Vaping Use   Vaping Use: Never used  Substance and Sexual Activity   Alcohol use: No   Drug use: No   Sexual activity: Yes  Other Topics Concern   Not on file  Social History Narrative   Not on file   Social Determinants of Health   Financial Resource Strain: Not on file  Food Insecurity: Not on file  Transportation Needs: Not on file  Physical Activity: Not on file  Stress: Not on file  Social Connections: Not on file  Intimate Partner Violence: Not on file    Family History  Problem Relation Age of Onset   Hypertension Mother    Hypertension Father     Allergies  Allergen Reactions   Ace Inhibitors Swelling    Review of Systems   Constitutional: Negative.   HENT: Negative.    Eyes: Negative.   Respiratory: Negative.    Cardiovascular: Negative.   Gastrointestinal: Negative.   Genitourinary: Negative.   Musculoskeletal: Negative.   Skin: Negative.   Neurological: Negative.   Psychiatric/Behavioral: Negative.         Objective:   BP 122/74   Pulse (!) 104   Ht 5\' 2"  (1.575 m)   Wt 206 lb (93.4 kg)   SpO2 97%   BMI 37.68 kg/m   Vitals:   11/04/22 1122  BP: 122/74  Pulse: (!) 104  Height: 5\' 2"  (1.575 m)  Weight: 206 lb (93.4 kg)  SpO2: 97%  BMI (Calculated): 37.67    Physical Exam Vitals and nursing note reviewed.  Constitutional:      Appearance: Normal appearance. She is obese.  Cardiovascular:     Rate and Rhythm: Normal rate and regular rhythm.  Pulmonary:     Effort: Pulmonary effort is normal.  Abdominal:     General: Bowel sounds are normal.     Palpations: Abdomen is soft.  Musculoskeletal:        General: Normal range of motion.     Cervical back: Normal range of motion.  Neurological:  General: No focal deficit present.     Mental Status: She is alert and oriented to person, place, and time.  Psychiatric:        Mood and Affect: Mood normal.        Behavior: Behavior normal.      No results found for any visits on 11/04/22.  Recent Results (from the past 2160 hour(s))  CMP14+EGFR     Status: Abnormal   Collection Time: 09/26/22  3:29 PM  Result Value Ref Range   Glucose 103 (H) 70 - 99 mg/dL   BUN 13 6 - 24 mg/dL   Creatinine, Ser 2.95 0.57 - 1.00 mg/dL   eGFR 78 >62 ZH/YQM/5.78   BUN/Creatinine Ratio 15 9 - 23   Sodium 139 134 - 144 mmol/L   Potassium 4.3 3.5 - 5.2 mmol/L   Chloride 102 96 - 106 mmol/L   CO2 19 (L) 20 - 29 mmol/L   Calcium 9.9 8.7 - 10.2 mg/dL   Total Protein 8.1 6.0 - 8.5 g/dL   Albumin 4.6 3.8 - 4.9 g/dL   Globulin, Total 3.5 1.5 - 4.5 g/dL   Albumin/Globulin Ratio 1.3 1.2 - 2.2   Bilirubin Total 0.3 0.0 - 1.2 mg/dL   Alkaline  Phosphatase 132 (H) 44 - 121 IU/L   AST 21 0 - 40 IU/L   ALT 20 0 - 32 IU/L  Lipase     Status: None   Collection Time: 09/26/22  3:29 PM  Result Value Ref Range   Lipase 25 14 - 72 U/L  Hemoglobin A1c     Status: Abnormal   Collection Time: 09/26/22  3:29 PM  Result Value Ref Range   Hgb A1c MFr Bld 6.1 (H) 4.8 - 5.6 %    Comment:          Prediabetes: 5.7 - 6.4          Diabetes: >6.4          Glycemic control for adults with diabetes: <7.0    Est. average glucose Bld gHb Est-mCnc 128 mg/dL  CBC With Differential     Status: Abnormal   Collection Time: 09/26/22  3:29 PM  Result Value Ref Range   WBC 10.0 3.4 - 10.8 x10E3/uL   RBC 4.20 3.77 - 5.28 x10E6/uL   Hemoglobin 12.2 11.1 - 15.9 g/dL   Hematocrit 46.9 62.9 - 46.6 %   MCV 84 79 - 97 fL   MCH 29.0 26.6 - 33.0 pg   MCHC 34.5 31.5 - 35.7 g/dL   RDW 52.8 41.3 - 24.4 %   Neutrophils 46 Not Estab. %   Lymphs 46 Not Estab. %   Monocytes 7 Not Estab. %   Eos 1 Not Estab. %   Basos 0 Not Estab. %   Neutrophils Absolute 4.6 1.4 - 7.0 x10E3/uL   Lymphocytes Absolute 4.6 (H) 0.7 - 3.1 x10E3/uL   Monocytes Absolute 0.7 0.1 - 0.9 x10E3/uL   EOS (ABSOLUTE) 0.1 0.0 - 0.4 x10E3/uL   Basophils Absolute 0.0 0.0 - 0.2 x10E3/uL   Immature Granulocytes 0 Not Estab. %   Immature Grans (Abs) 0.0 0.0 - 0.1 x10E3/uL  Lipid Panel w/o Chol/HDL Ratio     Status: Abnormal   Collection Time: 09/26/22  3:29 PM  Result Value Ref Range   Cholesterol, Total 209 (H) 100 - 199 mg/dL   Triglycerides 62 0 - 149 mg/dL   HDL 72 >01 mg/dL   VLDL Cholesterol Cal 11 5 - 40  mg/dL   LDL Chol Calc (NIH) 119 (H) 0 - 99 mg/dL      Assessment & Plan:  Increase Wegovy to 0.5 mg/week.  Continue other medications as such. Problem List Items Addressed This Visit     Essential hypertension, benign   Pre-diabetes   Mixed hyperlipidemia   Class 2 obesity due to excess calories without serious comorbidity with body mass index (BMI) of 38.0 to 38.9 in adult -  Primary   Relevant Medications   Semaglutide-Weight Management 0.5 MG/0.5ML SOAJ    Return in about 4 weeks (around 12/02/2022).   Total time spent: 25 minutes  Margaretann Loveless, MD  11/04/2022

## 2022-12-02 ENCOUNTER — Ambulatory Visit: Payer: Managed Care, Other (non HMO) | Admitting: Internal Medicine

## 2022-12-09 ENCOUNTER — Ambulatory Visit (INDEPENDENT_AMBULATORY_CARE_PROVIDER_SITE_OTHER): Payer: Managed Care, Other (non HMO) | Admitting: Internal Medicine

## 2022-12-09 ENCOUNTER — Encounter: Payer: Self-pay | Admitting: Internal Medicine

## 2022-12-09 VITALS — BP 120/80 | HR 91 | Ht 62.0 in | Wt 204.6 lb

## 2022-12-09 DIAGNOSIS — E6609 Other obesity due to excess calories: Secondary | ICD-10-CM | POA: Diagnosis not present

## 2022-12-09 DIAGNOSIS — R7303 Prediabetes: Secondary | ICD-10-CM

## 2022-12-09 DIAGNOSIS — I10 Essential (primary) hypertension: Secondary | ICD-10-CM | POA: Diagnosis not present

## 2022-12-09 DIAGNOSIS — E782 Mixed hyperlipidemia: Secondary | ICD-10-CM | POA: Diagnosis not present

## 2022-12-09 DIAGNOSIS — Z6838 Body mass index (BMI) 38.0-38.9, adult: Secondary | ICD-10-CM

## 2022-12-09 NOTE — Progress Notes (Signed)
Established Patient Office Visit  Subjective:  Patient ID: Jasmine Buck, female    DOB: 05-30-1961  Age: 62 y.o. MRN: [id number]  Chief Complaint  Patient presents with   Follow-up    4 week follow up    Patient comes in for her follow-up today.  She has lost 2 pounds since her last visit.  Patient reports that she got scared of using Wegovy so stopped it.  Did not have any side effects but is just concerned about taking extra medication for weight loss.  She has started watching her diet very carefully and has started more physical activity.  She is right now motivated to lose weight with her own efforts.  Patient encouraged questioning meal different types of meals discussed and advised to cut down on the portion sizes.  Increase her fluid intake.  Patient does not have any other complaints today.    No other concerns at this time.   Past Medical History:  Diagnosis Date   Fibroid    GERD (gastroesophageal reflux disease)    History of COVID-19    Hypertension    Mixed hyperlipidemia    Prediabetes     Past Surgical History:  Procedure Laterality Date   CESAREAN SECTION      Social History   Socioeconomic History   Marital status: Single    Spouse name: Not on file   Number of children: Not on file   Years of education: Not on file   Highest education level: Not on file  Occupational History   Not on file  Tobacco Use   Smoking status: Never   Smokeless tobacco: Never  Vaping Use   Vaping Use: Never used  Substance and Sexual Activity   Alcohol use: No   Drug use: No   Sexual activity: Yes  Other Topics Concern   Not on file  Social History Narrative   Not on file   Social Determinants of Health   Financial Resource Strain: Not on file  Food Insecurity: Not on file  Transportation Needs: Not on file  Physical Activity: Not on file  Stress: Not on file  Social Connections: Not on file  Intimate Partner Violence: Not on file    Family History   Problem Relation Age of Onset   Hypertension Mother    Hypertension Father     Allergies  Allergen Reactions   Ace Inhibitors Swelling    Review of Systems  Constitutional:  Negative for chills, diaphoresis, fever, malaise/fatigue and weight loss.  HENT:  Negative for congestion, ear discharge, hearing loss, sinus pain and tinnitus.   Eyes:  Negative for blurred vision, double vision, pain, discharge and redness.  Respiratory:  Negative for cough, shortness of breath, wheezing and stridor.   Cardiovascular:  Negative for chest pain, palpitations, leg swelling and PND.  Gastrointestinal:  Negative for abdominal pain, blood in stool, constipation, diarrhea, heartburn, melena, nausea and vomiting.  Genitourinary:  Negative for dysuria, frequency, hematuria and urgency.  Musculoskeletal:  Negative for myalgias and neck pain.  Skin: Negative.   Neurological: Negative.   Psychiatric/Behavioral:  Negative for depression, substance abuse and suicidal ideas. The patient is not nervous/anxious.        Objective:   BP 120/80   Pulse 91   Ht 5\' 2"  (1.575 m)   Wt 204 lb 9.6 oz (92.8 kg)   SpO2 95%   BMI 37.42 kg/m   Vitals:   12/09/22 0928  BP: 120/80  Pulse: 91  Height: 5\' 2"  (1.575 m)  Weight: 204 lb 9.6 oz (92.8 kg)  SpO2: 95%  BMI (Calculated): 37.41    Physical Exam Vitals and nursing note reviewed.  Constitutional:      Appearance: Normal appearance. She is obese.  HENT:     Head: Normocephalic.  Cardiovascular:     Rate and Rhythm: Normal rate and regular rhythm.     Pulses: Normal pulses.     Heart sounds: Normal heart sounds. No murmur heard. Pulmonary:     Effort: Pulmonary effort is normal.     Breath sounds: Normal breath sounds.  Abdominal:     General: Bowel sounds are normal.     Palpations: Abdomen is soft. There is no mass.     Tenderness: There is no abdominal tenderness. There is no right CVA tenderness or left CVA tenderness.  Musculoskeletal:         General: No swelling, deformity or signs of injury. Normal range of motion.     Cervical back: Normal range of motion and neck supple.     Right lower leg: No edema.     Left lower leg: No edema.  Neurological:     General: No focal deficit present.     Mental Status: She is alert and oriented to person, place, and time.  Psychiatric:        Mood and Affect: Mood normal.        Behavior: Behavior normal.      No results found for any visits on 12/09/22.      Assessment & Plan:  Patient encouraged to continue taking all her medications as such.  Strict diet control and exercise emphasized. Will follow-up in 6 weeks and monitor her blood pressure and weight loss. Problem List Items Addressed This Visit     Essential hypertension, benign   Pre-diabetes   Mixed hyperlipidemia   Class 2 obesity due to excess calories without serious comorbidity with body mass index (BMI) of 38.0 to 38.9 in adult - Primary    Return in about 6 weeks (around 01/20/2023).   Total time spent: 30 minutes  Margaretann Loveless, MD  12/09/2022   This document may have been prepared by [facility] Voice Recognition software and as such may include unintentional dictation errors.

## 2023-01-23 ENCOUNTER — Ambulatory Visit (INDEPENDENT_AMBULATORY_CARE_PROVIDER_SITE_OTHER): Payer: Managed Care, Other (non HMO) | Admitting: Internal Medicine

## 2023-01-23 ENCOUNTER — Encounter: Payer: Self-pay | Admitting: Internal Medicine

## 2023-01-23 VITALS — BP 124/78 | HR 79 | Ht 62.0 in | Wt 205.4 lb

## 2023-01-23 DIAGNOSIS — R7303 Prediabetes: Secondary | ICD-10-CM | POA: Diagnosis not present

## 2023-01-23 DIAGNOSIS — I10 Essential (primary) hypertension: Secondary | ICD-10-CM

## 2023-01-23 DIAGNOSIS — E782 Mixed hyperlipidemia: Secondary | ICD-10-CM

## 2023-01-23 MED ORDER — WEGOVY 0.25 MG/0.5ML ~~LOC~~ SOAJ: 0.2500 mg | SUBCUTANEOUS | Status: DC

## 2023-01-23 MED ORDER — WEGOVY 0.25 MG/0.5ML ~~LOC~~ SOAJ: 0.2500 mg | SUBCUTANEOUS | 0 refills | Status: DC

## 2023-01-23 NOTE — Progress Notes (Signed)
Established Patient Office Visit  Subjective:  Patient ID: Jasmine Buck, female    DOB: 1961-02-12  Age: 62 y.o. MRN: [id number]  Chief Complaint  Patient presents with   Follow-up    6 week follow up    Patient comes in for follow-up today.  She has been feeling well and managed to lose a couple of pounds according to her home scales.  She has been trying to exercise and control her diet.  Previously she was reluctant to start Wegovy injections but today she is motivated.  Would like  to start it from today to help her diet and exercise efforts. Will send in a prescription.    No other concerns at this time.   Past Medical History:  Diagnosis Date   Fibroid    GERD (gastroesophageal reflux disease)    History of COVID-19    Hypertension    Mixed hyperlipidemia    Prediabetes     Past Surgical History:  Procedure Laterality Date   CESAREAN SECTION      Social History   Socioeconomic History   Marital status: Single    Spouse name: Not on file   Number of children: Not on file   Years of education: Not on file   Highest education level: Not on file  Occupational History   Not on file  Tobacco Use   Smoking status: Never   Smokeless tobacco: Never  Vaping Use   Vaping Use: Never used  Substance and Sexual Activity   Alcohol use: No   Drug use: No   Sexual activity: Yes  Other Topics Concern   Not on file  Social History Narrative   Not on file   Social Determinants of Health   Financial Resource Strain: Not on file  Food Insecurity: Not on file  Transportation Needs: Not on file  Physical Activity: Not on file  Stress: Not on file  Social Connections: Not on file  Intimate Partner Violence: Not on file    Family History  Problem Relation Age of Onset   Hypertension Mother    Hypertension Father     Allergies  Allergen Reactions   Ace Inhibitors Swelling    Review of Systems  Constitutional: Negative.   HENT: Negative.    Eyes:  Negative.   Respiratory: Negative.  Negative for cough and shortness of breath.   Cardiovascular: Negative.  Negative for chest pain, palpitations and leg swelling.  Gastrointestinal: Negative.  Negative for abdominal pain, constipation, diarrhea, heartburn, nausea and vomiting.  Genitourinary: Negative.  Negative for dysuria and flank pain.  Musculoskeletal: Negative.  Negative for joint pain and myalgias.  Skin: Negative.   Neurological: Negative.  Negative for dizziness and headaches.  Endo/Heme/Allergies: Negative.   Psychiatric/Behavioral: Negative.  Negative for depression and suicidal ideas. The patient is not nervous/anxious.        Objective:   BP 124/78   Pulse 79   Ht 5\' 2"  (1.575 m)   Wt 205 lb 6.4 oz (93.2 kg)   SpO2 96%   BMI 37.57 kg/m   Vitals:   01/23/23 0941  BP: 124/78  Pulse: 79  Height: 5\' 2"  (1.575 m)  Weight: 205 lb 6.4 oz (93.2 kg)  SpO2: 96%  BMI (Calculated): 37.56    Physical Exam Vitals and nursing note reviewed.  Constitutional:      Appearance: Normal appearance.  HENT:     Head: Normocephalic and atraumatic.     Nose: Nose normal.  Mouth/Throat:     Mouth: Mucous membranes are moist.     Pharynx: Oropharynx is clear.  Eyes:     Conjunctiva/sclera: Conjunctivae normal.     Pupils: Pupils are equal, round, and reactive to light.  Cardiovascular:     Rate and Rhythm: Normal rate and regular rhythm.     Pulses: Normal pulses.     Heart sounds: Normal heart sounds. No murmur heard. Pulmonary:     Effort: Pulmonary effort is normal.     Breath sounds: Normal breath sounds. No wheezing.  Abdominal:     General: Bowel sounds are normal.     Palpations: Abdomen is soft.     Tenderness: There is no abdominal tenderness. There is no right CVA tenderness or left CVA tenderness.  Musculoskeletal:        General: Normal range of motion.     Cervical back: Normal range of motion.     Right lower leg: No edema.     Left lower leg: No  edema.  Skin:    General: Skin is warm and dry.  Neurological:     General: No focal deficit present.     Mental Status: She is alert and oriented to person, place, and time.  Psychiatric:        Mood and Affect: Mood normal.        Behavior: Behavior normal.      No results found for any visits on 01/23/23.  No results found for this or any previous visit (from the past 2160 hour(s)).    Assessment & Plan:  Patient is due for labs today.  Add Wegovy.  Continue diet and exercise regimen.  Monitor weight loss. Problem List Items Addressed This Visit     Essential hypertension, benign   Relevant Orders   CMP14+EGFR   Pre-diabetes - Primary   Relevant Medications   Semaglutide-Weight Management (WEGOVY) 0.25 MG/0.5ML SOAJ   Other Relevant Orders   Hemoglobin A1c   Mixed hyperlipidemia   Relevant Orders   Lipid Panel w/o Chol/HDL Ratio    Return in about 1 month (around 02/23/2023).   Total time spent: 25 minutes  Margaretann Loveless, MD  01/23/2023   This document may have been prepared by [facility] Voice Recognition software and as such may include unintentional dictation errors.

## 2023-01-24 LAB — HEMOGLOBIN A1C
Est. average glucose Bld gHb Est-mCnc: 131 mg/dL
Hgb A1c MFr Bld: 6.2 % — ABNORMAL HIGH (ref 4.8–5.6)

## 2023-01-24 LAB — CMP14+EGFR
ALT: 15 IU/L (ref 0–32)
AST: 19 IU/L (ref 0–40)
Albumin: 4.4 g/dL (ref 3.8–4.9)
Alkaline Phosphatase: 123 IU/L — ABNORMAL HIGH (ref 44–121)
BUN/Creatinine Ratio: 13 (ref 9–23)
BUN: 11 mg/dL (ref 6–24)
Bilirubin Total: 0.4 mg/dL (ref 0.0–1.2)
CO2: 22 mmol/L (ref 20–29)
Calcium: 9.9 mg/dL (ref 8.7–10.2)
Chloride: 102 mmol/L (ref 96–106)
Creatinine, Ser: 0.87 mg/dL (ref 0.57–1.00)
Globulin, Total: 3.5 g/dL (ref 1.5–4.5)
Glucose: 94 mg/dL (ref 70–99)
Potassium: 4.9 mmol/L (ref 3.5–5.2)
Sodium: 138 mmol/L (ref 134–144)
Total Protein: 7.9 g/dL (ref 6.0–8.5)
eGFR: 77 mL/min/{1.73_m2} (ref >59)

## 2023-01-24 LAB — LIPID PANEL W/O CHOL/HDL RATIO
Cholesterol, Total: 200 mg/dL — ABNORMAL HIGH (ref 100–199)
HDL: 70 mg/dL (ref >39)
LDL Chol Calc (NIH): 120 mg/dL — ABNORMAL HIGH (ref 0–99)
Triglycerides: 57 mg/dL (ref 0–149)
VLDL Cholesterol Cal: 10 mg/dL (ref 5–40)

## 2023-02-04 ENCOUNTER — Other Ambulatory Visit: Payer: Self-pay | Admitting: Internal Medicine

## 2023-02-04 DIAGNOSIS — R7303 Prediabetes: Secondary | ICD-10-CM

## 2023-02-06 ENCOUNTER — Other Ambulatory Visit: Payer: Self-pay | Admitting: Cardiology

## 2023-02-06 ENCOUNTER — Telehealth: Payer: Self-pay | Admitting: Internal Medicine

## 2023-02-06 DIAGNOSIS — R7303 Prediabetes: Secondary | ICD-10-CM

## 2023-02-06 MED ORDER — WEGOVY 0.25 MG/0.5ML ~~LOC~~ SOAJ: 0.2500 mg | SUBCUTANEOUS | 0 refills | Status: DC

## 2023-02-06 NOTE — Telephone Encounter (Signed)
Patient Jasmine Buck about her Reginal Lutes and she states she's wasted the dose and needs another one to be sent to the pharmacy and per pt her insurance will over ride the early transaction

## 2023-02-06 NOTE — Telephone Encounter (Signed)
Patient Jasmine Buck with answering service that she needs a new Rx for [facility]. She said she "split" the one she was supposed to have on Saturday.

## 2023-02-07 ENCOUNTER — Telehealth: Payer: Self-pay | Admitting: Internal Medicine

## 2023-02-07 NOTE — Telephone Encounter (Signed)
Patient left VM that she doesn't believe she got any of her Thursday dose of Wegovy. She was wanting to know if we could send her in another Rx for this. Please advise.

## 2023-02-24 ENCOUNTER — Ambulatory Visit (INDEPENDENT_AMBULATORY_CARE_PROVIDER_SITE_OTHER): Payer: Managed Care, Other (non HMO) | Admitting: Internal Medicine

## 2023-02-24 ENCOUNTER — Encounter: Payer: Self-pay | Admitting: Internal Medicine

## 2023-02-24 VITALS — BP 118/68 | HR 85 | Ht 62.0 in | Wt 200.4 lb

## 2023-02-24 DIAGNOSIS — E6609 Other obesity due to excess calories: Secondary | ICD-10-CM

## 2023-02-24 DIAGNOSIS — R7303 Prediabetes: Secondary | ICD-10-CM | POA: Diagnosis not present

## 2023-02-24 DIAGNOSIS — E782 Mixed hyperlipidemia: Secondary | ICD-10-CM

## 2023-02-24 DIAGNOSIS — I10 Essential (primary) hypertension: Secondary | ICD-10-CM

## 2023-02-24 DIAGNOSIS — Z6838 Body mass index (BMI) 38.0-38.9, adult: Secondary | ICD-10-CM

## 2023-02-24 MED ORDER — WEGOVY 0.5 MG/0.5ML ~~LOC~~ SOAJ
0.5000 mg | SUBCUTANEOUS | 2 refills | Status: DC
Start: 2023-02-24 — End: 2023-03-23

## 2023-02-24 MED ORDER — AMLODIPINE BESYLATE 10 MG PO TABS: 10.0000 mg | ORAL_TABLET | Freq: Every day | ORAL | 3 refills | Status: DC

## 2023-02-24 NOTE — Progress Notes (Signed)
Established Patient Office Visit  Subjective:  Patient ID: Jasmine Buck, female    DOB: 10-01-1960  Age: 62 y.o. MRN: [id number]  Chief Complaint  Patient presents with   Follow-up    1 month follow up    Patient comes in for her follow-up today.  She was started on Wegovy injections 0.25 mg/week at her last visit.  Patient states that she is tolerating it quite well.  She does not have any nausea or vomiting.  No constipation or diarrhea.  She is feeling quite well on it and has lost weight as well.  Will increase the dose to 0.5 mg/week now.  Patient encouraged to drink more water.    No other concerns at this time.   Past Medical History:  Diagnosis Date   Fibroid    GERD (gastroesophageal reflux disease)    History of COVID-19    Hypertension    Mixed hyperlipidemia    Prediabetes     Past Surgical History:  Procedure Laterality Date   CESAREAN SECTION      Social History   Socioeconomic History   Marital status: Single    Spouse name: Not on file   Number of children: Not on file   Years of education: Not on file   Highest education level: Not on file  Occupational History   Not on file  Tobacco Use   Smoking status: Never   Smokeless tobacco: Never  Vaping Use   Vaping status: Never Used  Substance and Sexual Activity   Alcohol use: No   Drug use: No   Sexual activity: Yes  Other Topics Concern   Not on file  Social History Narrative   Not on file   Social Determinants of Health   Financial Resource Strain: Not on file  Food Insecurity: Not on file  Transportation Needs: Not on file  Physical Activity: Not on file  Stress: Not on file  Social Connections: Not on file  Intimate Partner Violence: Not on file    Family History  Problem Relation Age of Onset   Hypertension Mother    Hypertension Father     Allergies  Allergen Reactions   Ace Inhibitors Swelling    Review of Systems  Constitutional: Negative.   HENT: Negative.     Eyes: Negative.   Respiratory: Negative.  Negative for cough and shortness of breath.   Cardiovascular: Negative.  Negative for chest pain, palpitations and leg swelling.  Gastrointestinal: Negative.  Negative for abdominal pain, constipation, diarrhea, heartburn, nausea and vomiting.  Genitourinary: Negative.  Negative for dysuria and flank pain.  Musculoskeletal: Negative.  Negative for joint pain and myalgias.  Skin: Negative.   Neurological: Negative.  Negative for dizziness and headaches.  Endo/Heme/Allergies: Negative.   Psychiatric/Behavioral: Negative.  Negative for depression and suicidal ideas. The patient is not nervous/anxious.        Objective:   BP 118/68   Pulse 85   Ht 5\' 2"  (1.575 m)   Wt 200 lb 6.4 oz (90.9 kg)   SpO2 97%   BMI 36.65 kg/m   Vitals:   02/24/23 1302  BP: 118/68  Pulse: 85  Height: 5\' 2"  (1.575 m)  Weight: 200 lb 6.4 oz (90.9 kg)  SpO2: 97%  BMI (Calculated): 36.64    Physical Exam Vitals and nursing note reviewed.  Constitutional:      Appearance: Normal appearance.  HENT:     Head: Normocephalic and atraumatic.     Nose: Nose  normal.     Mouth/Throat:     Mouth: Mucous membranes are moist.     Pharynx: Oropharynx is clear.  Eyes:     Conjunctiva/sclera: Conjunctivae normal.     Pupils: Pupils are equal, round, and reactive to light.  Cardiovascular:     Rate and Rhythm: Normal rate and regular rhythm.     Pulses: Normal pulses.     Heart sounds: Normal heart sounds. No murmur heard. Pulmonary:     Effort: Pulmonary effort is normal.     Breath sounds: Normal breath sounds. No wheezing.  Abdominal:     General: Bowel sounds are normal.     Palpations: Abdomen is soft.     Tenderness: There is no abdominal tenderness. There is no right CVA tenderness or left CVA tenderness.  Musculoskeletal:        General: Normal range of motion.     Cervical back: Normal range of motion.     Right lower leg: No edema.     Left lower  leg: No edema.  Skin:    General: Skin is warm and dry.  Neurological:     General: No focal deficit present.     Mental Status: She is alert and oriented to person, place, and time.  Psychiatric:        Mood and Affect: Mood normal.        Behavior: Behavior normal.      No results found for any visits on 02/24/23.  Recent Results (from the past 2160 hour(s))  Hemoglobin A1c     Status: Abnormal   Collection Time: 01/23/23 10:22 AM  Result Value Ref Range   Hgb A1c MFr Bld 6.2 (H) 4.8 - 5.6 %    Comment:          Prediabetes: 5.7 - 6.4          Diabetes: >6.4          Glycemic control for adults with diabetes: <7.0    Est. average glucose Bld gHb Est-mCnc 131 mg/dL  Lipid Panel w/o Chol/HDL Ratio     Status: Abnormal   Collection Time: 01/23/23 10:22 AM  Result Value Ref Range   Cholesterol, Total 200 (H) 100 - 199 mg/dL   Triglycerides 57 0 - 149 mg/dL   HDL 70 >16 mg/dL   VLDL Cholesterol Cal 10 5 - 40 mg/dL   LDL Chol Calc (NIH) 109 (H) 0 - 99 mg/dL  [id number]     Status: Abnormal   Collection Time: 01/23/23 10:22 AM  Result Value Ref Range   Glucose 94 70 - 99 mg/dL   BUN 11 6 - 24 mg/dL   Creatinine, Ser 1.91 0.57 - 1.00 mg/dL   eGFR 77 >47 WG/NFA/2.13   BUN/Creatinine Ratio 13 9 - 23   Sodium 138 134 - 144 mmol/L   Potassium 4.9 3.5 - 5.2 mmol/L   Chloride 102 96 - 106 mmol/L   CO2 22 20 - 29 mmol/L   Calcium 9.9 8.7 - 10.2 mg/dL   Total Protein 7.9 6.0 - 8.5 g/dL   Albumin 4.4 3.8 - 4.9 g/dL   Globulin, Total 3.5 1.5 - 4.5 g/dL   Bilirubin Total 0.4 0.0 - 1.2 mg/dL   Alkaline Phosphatase 123 (H) 44 - 121 IU/L   AST 19 0 - 40 IU/L   ALT 15 0 - 32 IU/L      Assessment & Plan:  To continue current medications.  Increase dose of [facility]  to 0.5 mg/week.  Strict diet control and exercise also emphasized. Problem List Items Addressed This Visit     Essential hypertension, benign - Primary   Relevant Medications   amLODipine (NORVASC) 10 MG tablet    Pre-diabetes   Relevant Medications   Semaglutide-Weight Management (WEGOVY) 0.5 MG/0.5ML SOAJ   Mixed hyperlipidemia   Relevant Medications   amLODipine (NORVASC) 10 MG tablet   Class 2 obesity due to excess calories without serious comorbidity with body mass index (BMI) of 38.0 to 38.9 in adult   Relevant Medications   Semaglutide-Weight Management (WEGOVY) 0.5 MG/0.5ML SOAJ    Return in about 1 month (around 03/27/2023).   Total time spent: 25 minutes  Margaretann Loveless, MD  02/24/2023   This document may have been prepared by [facility] Voice Recognition software and as such may include unintentional dictation errors.

## 2023-03-23 ENCOUNTER — Encounter: Payer: Self-pay | Admitting: Internal Medicine

## 2023-03-23 ENCOUNTER — Ambulatory Visit (INDEPENDENT_AMBULATORY_CARE_PROVIDER_SITE_OTHER): Payer: Managed Care, Other (non HMO) | Admitting: Internal Medicine

## 2023-03-23 VITALS — BP 115/79 | HR 89 | Ht 62.0 in | Wt 200.0 lb

## 2023-03-23 DIAGNOSIS — R7303 Prediabetes: Secondary | ICD-10-CM

## 2023-03-23 DIAGNOSIS — E782 Mixed hyperlipidemia: Secondary | ICD-10-CM | POA: Diagnosis not present

## 2023-03-23 DIAGNOSIS — I10 Essential (primary) hypertension: Secondary | ICD-10-CM | POA: Diagnosis not present

## 2023-03-23 DIAGNOSIS — Z6838 Body mass index (BMI) 38.0-38.9, adult: Secondary | ICD-10-CM

## 2023-03-23 DIAGNOSIS — E6609 Other obesity due to excess calories: Secondary | ICD-10-CM | POA: Diagnosis not present

## 2023-03-23 MED ORDER — WEGOVY 1 MG/0.5ML ~~LOC~~ SOAJ
1.0000 mg | SUBCUTANEOUS | 2 refills | Status: DC
Start: 2023-03-23 — End: 2023-06-27

## 2023-03-23 NOTE — Progress Notes (Addendum)
Established Patient Office Visit  Subjective:  Patient ID: Jasmine Buck, female    DOB: April 19, 1961  Age: 62 y.o. MRN: [id number]  Chief Complaint  Patient presents with   Follow-up    1 month follow up    Patient comes in for follow-up.  Currently she is on the [facility] injections 0.5 mg daily.  She is tolerating it well and has no complaints of nausea, vomiting, constipation or abdominal pain.  According to her home scales she has lost more weight which she is noticing that her clothes are fitting better.  Will increase the dose to 1 mg/week and maintain it for a while. Patient also advised to control her diet and continue exercise regimen..    No other concerns at this time.   Past Medical History:  Diagnosis Date   Fibroid    GERD (gastroesophageal reflux disease)    History of COVID-19    Hypertension    Mixed hyperlipidemia    Prediabetes     Past Surgical History:  Procedure Laterality Date   CESAREAN SECTION      Social History   Socioeconomic History   Marital status: Single    Spouse name: Not on file   Number of children: Not on file   Years of education: Not on file   Highest education level: Not on file  Occupational History   Not on file  Tobacco Use   Smoking status: Never   Smokeless tobacco: Never  Vaping Use   Vaping status: Never Used  Substance and Sexual Activity   Alcohol use: No   Drug use: No   Sexual activity: Yes  Other Topics Concern   Not on file  Social History Narrative   Not on file   Social Determinants of Health   Financial Resource Strain: Not on file  Food Insecurity: Not on file  Transportation Needs: Not on file  Physical Activity: Not on file  Stress: Not on file  Social Connections: Not on file  Intimate Partner Violence: Not on file    Family History  Problem Relation Age of Onset   Hypertension Mother    Hypertension Father     Allergies  Allergen Reactions   Ace Inhibitors Swelling    ROS      Objective:   BP 115/79   Pulse 89   Ht 5\' 2"  (1.575 m)   Wt 200 lb (90.7 kg)   SpO2 99%   BMI 36.58 kg/m   Vitals:   03/23/23 0939  BP: 115/79  Pulse: 89  Height: 5\' 2"  (1.575 m)  Weight: 200 lb (90.7 kg)  SpO2: 99%  BMI (Calculated): 36.57    Physical Exam   No results found for any visits on 03/23/23.  Recent Results (from the past 2160 hour(s))  Hemoglobin A1c     Status: Abnormal   Collection Time: 01/23/23 10:22 AM  Result Value Ref Range   Hgb A1c MFr Bld 6.2 (H) 4.8 - 5.6 %    Comment:          Prediabetes: 5.7 - 6.4          Diabetes: >6.4          Glycemic control for adults with diabetes: <7.0    Est. average glucose Bld gHb Est-mCnc 131 mg/dL  Lipid Panel w/o Chol/HDL Ratio     Status: Abnormal   Collection Time: 01/23/23 10:22 AM  Result Value Ref Range   Cholesterol, Total 200 (H) 100 -  199 mg/dL   Triglycerides 57 0 - 149 mg/dL   HDL 70 >16 mg/dL   VLDL Cholesterol Cal 10 5 - 40 mg/dL   LDL Chol Calc (NIH) 109 (H) 0 - 99 mg/dL  [id number]     Status: Abnormal   Collection Time: 01/23/23 10:22 AM  Result Value Ref Range   Glucose 94 70 - 99 mg/dL   BUN 11 6 - 24 mg/dL   Creatinine, Ser 1.91 0.57 - 1.00 mg/dL   eGFR 77 >47 WG/NFA/2.13   BUN/Creatinine Ratio 13 9 - 23   Sodium 138 134 - 144 mmol/L   Potassium 4.9 3.5 - 5.2 mmol/L   Chloride 102 96 - 106 mmol/L   CO2 22 20 - 29 mmol/L   Calcium 9.9 8.7 - 10.2 mg/dL   Total Protein 7.9 6.0 - 8.5 g/dL   Albumin 4.4 3.8 - 4.9 g/dL   Globulin, Total 3.5 1.5 - 4.5 g/dL   Bilirubin Total 0.4 0.0 - 1.2 mg/dL   Alkaline Phosphatase 123 (H) 44 - 121 IU/L   AST 19 0 - 40 IU/L   ALT 15 0 - 32 IU/L      Assessment & Plan:  Increase Wegovy to 1 mg/week.  Continue strict diet control and exercise. Problem List Items Addressed This Visit     Essential hypertension, benign - Primary   Pre-diabetes   Mixed hyperlipidemia   Class 2 obesity due to excess calories without serious comorbidity with  body mass index (BMI) of 38.0 to 38.9 in adult   Relevant Medications   Semaglutide-Weight Management (WEGOVY) 1 MG/0.5ML SOAJ    Return in about 6 weeks (around 05/04/2023).   Total time spent: 25 minutes  Margaretann Loveless, MD  03/23/2023   This document may have been prepared by [facility] Voice Recognition software and as such may include unintentional dictation errors.

## 2023-03-27 ENCOUNTER — Ambulatory Visit: Payer: Managed Care, Other (non HMO) | Admitting: Internal Medicine

## 2023-04-09 ENCOUNTER — Other Ambulatory Visit: Payer: Self-pay | Admitting: Cardiovascular Disease

## 2023-05-05 ENCOUNTER — Encounter: Payer: Self-pay | Admitting: Internal Medicine

## 2023-05-05 ENCOUNTER — Ambulatory Visit (INDEPENDENT_AMBULATORY_CARE_PROVIDER_SITE_OTHER): Payer: Managed Care, Other (non HMO) | Admitting: Internal Medicine

## 2023-05-05 VITALS — BP 122/82 | HR 77 | Ht 62.0 in | Wt 195.6 lb

## 2023-05-05 DIAGNOSIS — E66812 Obesity, class 2: Secondary | ICD-10-CM

## 2023-05-05 DIAGNOSIS — Z23 Encounter for immunization: Secondary | ICD-10-CM | POA: Diagnosis not present

## 2023-05-05 DIAGNOSIS — I10 Essential (primary) hypertension: Secondary | ICD-10-CM | POA: Diagnosis not present

## 2023-05-05 DIAGNOSIS — Z6838 Body mass index (BMI) 38.0-38.9, adult: Secondary | ICD-10-CM

## 2023-05-05 DIAGNOSIS — R7303 Prediabetes: Secondary | ICD-10-CM | POA: Diagnosis not present

## 2023-05-05 DIAGNOSIS — E782 Mixed hyperlipidemia: Secondary | ICD-10-CM

## 2023-05-05 DIAGNOSIS — E6609 Other obesity due to excess calories: Secondary | ICD-10-CM

## 2023-05-05 NOTE — Progress Notes (Addendum)
Established Patient Office Visit  Subjective:  Patient ID: Jasmine Buck, female    DOB: 1960-09-29  Age: 62 y.o. MRN: [id number]  Chief Complaint  Patient presents with   Follow-up    6 week follow up    Patient comes in for her follow-up today.  She is using Wegovy injection 1 mg/week and is losing weight .  She feels very well and has no complaints of nausea, vomiting or constipation.  She drinks plenty of fluids and also exercises. Will continue current dose of Wegovy.    No other concerns at this time.   Past Medical History:  Diagnosis Date   Fibroid    GERD (gastroesophageal reflux disease)    History of COVID-19    Hypertension    Mixed hyperlipidemia    Prediabetes     Past Surgical History:  Procedure Laterality Date   CESAREAN SECTION      Social History   Socioeconomic History   Marital status: Single    Spouse name: Not on file   Number of children: Not on file   Years of education: Not on file   Highest education level: Not on file  Occupational History   Not on file  Tobacco Use   Smoking status: Never   Smokeless tobacco: Never  Vaping Use   Vaping status: Never Used  Substance and Sexual Activity   Alcohol use: No   Drug use: No   Sexual activity: Yes  Other Topics Concern   Not on file  Social History Narrative   Not on file   Social Determinants of Health   Financial Resource Strain: Not on file  Food Insecurity: Not on file  Transportation Needs: Not on file  Physical Activity: Not on file  Stress: Not on file  Social Connections: Not on file  Intimate Partner Violence: Not on file    Family History  Problem Relation Age of Onset   Hypertension Mother    Hypertension Father     Allergies  Allergen Reactions   Ace Inhibitors Swelling    Review of Systems  Constitutional:  Positive for weight loss. Negative for chills, fever and malaise/fatigue.  HENT: Negative.  Negative for congestion and sore throat.   Eyes:  Negative.   Respiratory: Negative.  Negative for cough and shortness of breath.   Cardiovascular: Negative.  Negative for chest pain, palpitations and leg swelling.  Gastrointestinal: Negative.  Negative for abdominal pain, constipation, diarrhea, heartburn, nausea and vomiting.  Genitourinary: Negative.  Negative for dysuria and flank pain.  Musculoskeletal: Negative.  Negative for joint pain and myalgias.  Skin: Negative.   Neurological: Negative.  Negative for dizziness and headaches.  Endo/Heme/Allergies: Negative.   Psychiatric/Behavioral: Negative.  Negative for depression and suicidal ideas. The patient is not nervous/anxious.        Objective:   BP 122/82   Pulse 77   Ht 5\' 2"  (1.575 m)   Wt 195 lb 9.6 oz (88.7 kg)   SpO2 100%   BMI 35.78 kg/m   Vitals:   05/05/23 0909  BP: 122/82  Pulse: 77  Height: 5\' 2"  (1.575 m)  Weight: 195 lb 9.6 oz (88.7 kg)  SpO2: 100%  BMI (Calculated): 35.77    Physical Exam Vitals and nursing note reviewed.  Constitutional:      Appearance: Normal appearance.  HENT:     Head: Normocephalic and atraumatic.     Nose: Nose normal.     Mouth/Throat:  Mouth: Mucous membranes are moist.     Pharynx: Oropharynx is clear.  Eyes:     Conjunctiva/sclera: Conjunctivae normal.     Pupils: Pupils are equal, round, and reactive to light.  Cardiovascular:     Rate and Rhythm: Normal rate and regular rhythm.     Pulses: Normal pulses.     Heart sounds: Normal heart sounds. No murmur heard. Pulmonary:     Effort: Pulmonary effort is normal.     Breath sounds: Normal breath sounds. No wheezing.  Abdominal:     General: Bowel sounds are normal.     Palpations: Abdomen is soft.     Tenderness: There is no abdominal tenderness. There is no right CVA tenderness or left CVA tenderness.  Musculoskeletal:        General: Normal range of motion.     Cervical back: Normal range of motion.     Right lower leg: No edema.     Left lower leg: No  edema.  Skin:    General: Skin is warm and dry.  Neurological:     General: No focal deficit present.     Mental Status: She is alert and oriented to person, place, and time.  Psychiatric:        Mood and Affect: Mood normal.        Behavior: Behavior normal.      No results found for any visits on 05/05/23.  No results found for this or any previous visit (from the past 2160 hour(s)).    Assessment & Plan:  Continue all medications including Wegovy at 1 mg/week.  Monitor blood pressure and weight. Problem List Items Addressed This Visit     Essential hypertension, benign - Primary   Pre-diabetes   Mixed hyperlipidemia   Class 2 obesity due to excess calories without serious comorbidity with body mass index (BMI) of 38.0 to 38.9 in adult   Other Visit Diagnoses     Need for immunization against influenza       Relevant Orders   Influenza, MDCK, trivalent, PF(Flucelvax egg-free) (Completed)       Return in about 6 weeks (around 06/16/2023).   Total time spent: 25 minutes  Margaretann Loveless, MD  05/05/2023   This document may have been prepared by [facility] Voice Recognition software and as such may include unintentional dictation errors.

## 2023-05-23 ENCOUNTER — Other Ambulatory Visit: Payer: Self-pay | Admitting: Internal Medicine

## 2023-06-23 ENCOUNTER — Encounter: Payer: Self-pay | Admitting: Internal Medicine

## 2023-06-23 ENCOUNTER — Ambulatory Visit (INDEPENDENT_AMBULATORY_CARE_PROVIDER_SITE_OTHER): Payer: Managed Care, Other (non HMO) | Admitting: Internal Medicine

## 2023-06-23 VITALS — BP 115/73 | HR 95 | Ht 62.0 in | Wt 192.4 lb

## 2023-06-23 DIAGNOSIS — E66812 Obesity, class 2: Secondary | ICD-10-CM | POA: Diagnosis not present

## 2023-06-23 DIAGNOSIS — R7303 Prediabetes: Secondary | ICD-10-CM

## 2023-06-23 DIAGNOSIS — I10 Essential (primary) hypertension: Secondary | ICD-10-CM

## 2023-06-23 DIAGNOSIS — E782 Mixed hyperlipidemia: Secondary | ICD-10-CM | POA: Diagnosis not present

## 2023-06-23 DIAGNOSIS — Z6838 Body mass index (BMI) 38.0-38.9, adult: Secondary | ICD-10-CM

## 2023-06-23 DIAGNOSIS — E6609 Other obesity due to excess calories: Secondary | ICD-10-CM

## 2023-06-23 NOTE — Progress Notes (Signed)
Established Patient Office Visit  Subjective:  Patient ID: Jasmine Buck, female    DOB: November 15, 1960  Age: 62 y.o. MRN: [id number]  Chief Complaint  Patient presents with   Follow-up    Patient comes in for her follow-up today.  She is currently on Wegovy 1 mg/week injections and is still losing weight comfortably.  She denies nausea or vomiting, no abdominal pain or constipation.  Her blood pressure looks good.  She is fasting for blood work today.  She would like to continue Wegovy at the current dose for now.    No other concerns at this time.   Past Medical History:  Diagnosis Date   Fibroid    GERD (gastroesophageal reflux disease)    History of COVID-19    Hypertension    Mixed hyperlipidemia    Prediabetes     Past Surgical History:  Procedure Laterality Date   CESAREAN SECTION      Social History   Socioeconomic History   Marital status: Single    Spouse name: Not on file   Number of children: Not on file   Years of education: Not on file   Highest education level: Not on file  Occupational History   Not on file  Tobacco Use   Smoking status: Never   Smokeless tobacco: Never  Vaping Use   Vaping status: Never Used  Substance and Sexual Activity   Alcohol use: No   Drug use: No   Sexual activity: Yes  Other Topics Concern   Not on file  Social History Narrative   Not on file   Social Determinants of Health   Financial Resource Strain: Not on file  Food Insecurity: Not on file  Transportation Needs: Not on file  Physical Activity: Not on file  Stress: Not on file  Social Connections: Not on file  Intimate Partner Violence: Not on file    Family History  Problem Relation Age of Onset   Hypertension Mother    Hypertension Father     Allergies  Allergen Reactions   Ace Inhibitors Swelling    Outpatient Medications Prior to Visit  Medication Sig   amLODipine (NORVASC) 10 MG tablet Take 1 tablet (10 mg total) by mouth daily.    Berberine Chloride 500 MG CAPS Take by mouth.   calcium carbonate (OSCAL) 1500 (600 Ca) MG TABS tablet Take 1,500 mg by mouth 2 (two) times daily with a meal.   Cholecalciferol (D 1000) 25 MCG (1000 UT) capsule 1,000 Units daily.   co-enzyme Q-10 30 MG capsule Take 30 mg by mouth 3 (three) times daily.   metoprolol succinate (TOPROL-XL) 100 MG 24 hr tablet TAKE 1 TABLET BY MOUTH ONCE  DAILY   Semaglutide-Weight Management (WEGOVY) 1 MG/0.5ML SOAJ Inject 1 mg into the skin once a week.   [DISCONTINUED] atorvastatin (LIPITOR) 10 MG tablet Take 10 mg by mouth daily. (Patient not taking: Reported on 05/05/2023)   [DISCONTINUED] pantoprazole (PROTONIX) 40 MG tablet Take 1 tablet (40 mg total) by mouth daily. (Patient not taking: Reported on 05/05/2023)   No facility-administered medications prior to visit.    Review of Systems  Constitutional: Negative.  Negative for chills, fever and malaise/fatigue.  HENT: Negative.    Eyes: Negative.   Respiratory: Negative.  Negative for cough and shortness of breath.   Cardiovascular: Negative.  Negative for chest pain, palpitations and leg swelling.  Gastrointestinal: Negative.  Negative for abdominal pain, blood in stool, constipation, diarrhea, heartburn, melena, nausea and  vomiting.  Genitourinary: Negative.  Negative for dysuria and flank pain.  Musculoskeletal: Negative.  Negative for joint pain and myalgias.  Neurological: Negative.  Negative for dizziness and headaches.  Endo/Heme/Allergies: Negative.   Psychiatric/Behavioral: Negative.  Negative for depression and suicidal ideas. The patient is not nervous/anxious.        Objective:   BP 115/73   Pulse 95   Ht 5\' 2"  (1.575 m)   Wt 192 lb 6.4 oz (87.3 kg)   SpO2 98%   BMI 35.19 kg/m   Vitals:   06/23/23 0919  BP: 115/73  Pulse: 95  Height: 5\' 2"  (1.575 m)  Weight: 192 lb 6.4 oz (87.3 kg)  SpO2: 98%  BMI (Calculated): 35.18    Physical Exam Vitals and nursing note reviewed.   Constitutional:      Appearance: Normal appearance.  HENT:     Head: Normocephalic and atraumatic.     Nose: Nose normal.     Mouth/Throat:     Mouth: Mucous membranes are moist.     Pharynx: Oropharynx is clear.  Eyes:     Conjunctiva/sclera: Conjunctivae normal.     Pupils: Pupils are equal, round, and reactive to light.  Cardiovascular:     Rate and Rhythm: Normal rate and regular rhythm.     Pulses: Normal pulses.     Heart sounds: Normal heart sounds. No murmur heard. Pulmonary:     Effort: Pulmonary effort is normal.     Breath sounds: Normal breath sounds. No wheezing.  Abdominal:     General: Bowel sounds are normal.     Palpations: Abdomen is soft.     Tenderness: There is no abdominal tenderness. There is no right CVA tenderness or left CVA tenderness.  Musculoskeletal:        General: Normal range of motion.     Cervical back: Normal range of motion.     Right lower leg: No edema.     Left lower leg: No edema.  Skin:    General: Skin is warm and dry.  Neurological:     General: No focal deficit present.     Mental Status: She is alert and oriented to person, place, and time.  Psychiatric:        Mood and Affect: Mood normal.        Behavior: Behavior normal.      No results found for any visits on 06/23/23.  No results found for this or any previous visit (from the past 2160 hour(s)).    Assessment & Plan:  Continue current medications. Labs today. Maintain a strict diet and exercise regimen. Problem List Items Addressed This Visit     Essential hypertension, benign - Primary   Relevant Orders   CMP14+EGFR   Pre-diabetes   Relevant Orders   Hemoglobin A1c   Mixed hyperlipidemia   Relevant Orders   Lipid Panel w/o Chol/HDL Ratio   Class 2 obesity due to excess calories without serious comorbidity with body mass index (BMI) of 38.0 to 38.9 in adult    Return in about 2 months (around 08/24/2023).   Total time spent: 30 minutes  Margaretann Loveless,  MD  06/23/2023   This document may have been prepared by [facility] Voice Recognition software and as such may include unintentional dictation errors.

## 2023-06-24 LAB — CMP14+EGFR
ALT: 9 [IU]/L (ref 0–32)
AST: 12 [IU]/L (ref 0–40)
Albumin: 4.1 g/dL (ref 3.8–4.9)
Alkaline Phosphatase: 110 [IU]/L (ref 44–121)
BUN/Creatinine Ratio: 13 (ref 12–28)
BUN: 11 mg/dL (ref 8–27)
Bilirubin Total: 0.3 mg/dL (ref 0.0–1.2)
CO2: 21 mmol/L (ref 20–29)
Calcium: 9.5 mg/dL (ref 8.7–10.3)
Chloride: 102 mmol/L (ref 96–106)
Creatinine, Ser: 0.87 mg/dL (ref 0.57–1.00)
Globulin, Total: 3.5 g/dL (ref 1.5–4.5)
Glucose: 83 mg/dL (ref 70–99)
Potassium: 4.4 mmol/L (ref 3.5–5.2)
Sodium: 137 mmol/L (ref 134–144)
Total Protein: 7.6 g/dL (ref 6.0–8.5)
eGFR: 76 mL/min/{1.73_m2} (ref >59)

## 2023-06-24 LAB — HEMOGLOBIN A1C
Est. average glucose Bld gHb Est-mCnc: 126 mg/dL
Hgb A1c MFr Bld: 6 % — ABNORMAL HIGH (ref 4.8–5.6)

## 2023-06-24 LAB — LIPID PANEL W/O CHOL/HDL RATIO
Cholesterol, Total: 187 mg/dL (ref 100–199)
HDL: 63 mg/dL (ref >39)
LDL Chol Calc (NIH): 115 mg/dL — ABNORMAL HIGH (ref 0–99)
Triglycerides: 44 mg/dL (ref 0–149)
VLDL Cholesterol Cal: 9 mg/dL (ref 5–40)

## 2023-06-27 ENCOUNTER — Telehealth: Payer: Self-pay

## 2023-06-27 ENCOUNTER — Other Ambulatory Visit: Payer: Self-pay | Admitting: Internal Medicine

## 2023-06-27 DIAGNOSIS — E66812 Obesity, class 2: Secondary | ICD-10-CM

## 2023-06-27 MED ORDER — WEGOVY 1 MG/0.5ML ~~LOC~~ SOAJ: 1.0000 mg | SUBCUTANEOUS | 4 refills | Status: DC

## 2023-06-27 NOTE — Telephone Encounter (Signed)
Patient called wanting a refill for Wegovy 1.0. Please advise.

## 2023-07-24 ENCOUNTER — Other Ambulatory Visit: Payer: Self-pay | Admitting: Internal Medicine

## 2023-07-24 DIAGNOSIS — E66812 Obesity, class 2: Secondary | ICD-10-CM

## 2023-07-26 NOTE — Progress Notes (Signed)
 ANNUAL PREVENTATIVE CARE GYNECOLOGY  ENCOUNTER NOTE  Subjective:       Jasmine Buck is a 63 y.o. [phone] female here for a routine annual gynecologic exam. The patient is sexually active. The patient is not taking hormone replacement therapy. Patient denies post-menopausal vaginal bleeding. The patient wears seatbelts: yes. The patient participates in regular exercise: yes. Has the patient ever been transfused or tattooed?: yes. The patient reports that there is not domestic violence in her life.  Current complaints: 1.  She has no new concerns today.     Gynecologic History No LMP recorded. Patient is postmenopausal. Contraception: post menopausal status Last Pap: 05/25/2020. Results were: normal Last mammogram: 08/26/2022. Results were: normal Last Colonoscopy: 03/29/2013: normal. Last Dexa Scan: Never had one   Obstetric History OB History  Gravida Para Term Preterm AB Living  4 2 2  0 2 2  SAB IAB Ectopic Multiple Live Births  0 2 0 0 2    # Outcome Date GA Lbr Len/2nd Weight Sex Type Anes PTL Lv  4 Term 06/28/01   5 lb 15 oz (2.693 kg) M CS-Unspec   LIV  3 IAB 1991          2 IAB 1988          1 Term 09/20/83   7 lb 6 oz (3.345 kg) M Vag-Spont   LIV    Past Medical History:  Diagnosis Date   Fibroid    GERD (gastroesophageal reflux disease)    History of COVID-19    Hypertension    Mixed hyperlipidemia    Prediabetes     Family History  Problem Relation Age of Onset   Hypertension Mother    Hypertension Father     Past Surgical History:  Procedure Laterality Date   CESAREAN SECTION       Social History   Socioeconomic History   Marital status: Single    Spouse name: Not on file   Number of children: Not on file   Years of education: Not on file   Highest education level: Not on file  Occupational History   Not on file  Tobacco Use   Smoking status: Never   Smokeless tobacco: Never  Vaping Use   Vaping status: Never Used  Substance and  Sexual Activity   Alcohol use: No   Drug use: No   Sexual activity: Yes  Other Topics Concern   Not on file  Social History Narrative   Not on file   Social Drivers of Health   Financial Resource Strain: Not on file  Food Insecurity: Not on file  Transportation Needs: Not on file  Physical Activity: Not on file  Stress: Not on file  Social Connections: Not on file  Intimate Partner Violence: Not on file    Current Outpatient Medications on File Prior to Visit  Medication Sig Dispense Refill   amLODipine  (NORVASC ) 10 MG tablet Take 1 tablet (10 mg total) by mouth daily. 90 tablet 3   calcium carbonate (OSCAL) 1500 (600 Ca) MG TABS tablet Take 1,500 mg by mouth 2 (two) times daily with a meal.     Cholecalciferol (D 1000) 25 MCG (1000 UT) capsule 1,000 Units daily.     co-enzyme Q-10 30 MG capsule Take 30 mg by mouth 3 (three) times daily.     metoprolol succinate (TOPROL-XL) 100 MG 24 hr tablet TAKE 1 TABLET BY MOUTH ONCE  DAILY 90 tablet 3   Semaglutide -Weight Management (WEGOVY ) 1  MG/0.5ML SOAJ INJECT 1MG  INTO THE SKIN ONCE A WEEK 2 mL 4   Berberine Chloride 500 MG CAPS Take by mouth. (Patient not taking: Reported on 07/27/2023)     No current facility-administered medications on file prior to visit.    Allergies  Allergen Reactions   Ace Inhibitors Swelling      Review of Systems ROS Review of Systems - General ROS: negative for - chills, fatigue, fever, hot flashes, night sweats, weight gain or weight loss Psychological ROS: negative for - anxiety, decreased libido, depression, mood swings, physical abuse or sexual abuse Ophthalmic ROS: negative for - blurry vision, eye pain or loss of vision ENT ROS: negative for - headaches, hearing change, visual changes or vocal changes Allergy and Immunology ROS: negative for - hives, itchy/watery eyes or seasonal allergies Hematological and Lymphatic ROS: negative for - bleeding problems, bruising, swollen lymph nodes or weight  loss Endocrine ROS: negative for - galactorrhea, hair pattern changes, hot flashes, malaise/lethargy, mood swings, palpitations, polydipsia/polyuria, skin changes, temperature intolerance or unexpected weight changes Breast ROS: negative for - new or changing breast lumps or nipple discharge Respiratory ROS: negative for - cough or shortness of breath Cardiovascular ROS: negative for - chest pain, irregular heartbeat, palpitations or shortness of breath Gastrointestinal ROS: no abdominal pain, change in bowel habits, or black or bloody stools Genito-Urinary ROS: no dysuria, trouble voiding, or hematuria Musculoskeletal ROS: negative for - joint pain or joint stiffness Neurological ROS: negative for - bowel and bladder control changes Dermatological ROS: negative for rash and skin lesion changes   Objective:   BP 111/74   Pulse 88   Ht 5' 2 (1.575 m)   Wt 192 lb 14.4 oz (87.5 kg)   BMI 35.28 kg/m   CONSTITUTIONAL: Well-developed, well-nourished female in no acute distress, moderate obesity PSYCHIATRIC: Normal mood and affect. Normal behavior. Normal judgment and thought content. NEUROLGIC: Alert and oriented to person, place, and time. Normal muscle tone coordination. No cranial nerve deficit noted. HENT:  Normocephalic, atraumatic, External right and left ear normal. Oropharynx is clear and moist EYES: Conjunctivae and EOM are normal. Pupils are equal, round, and reactive to light. No scleral icterus.  NECK: Normal range of motion, supple, no masses.  Normal thyroid .  SKIN: Skin is warm and dry. No rash noted. Not diaphoretic. No erythema. No pallor. CARDIOVASCULAR: Normal heart rate noted, regular rhythm, no murmur. RESPIRATORY: Clear to auscultation bilaterally. Effort and breath sounds normal, no problems with respiration noted. BREASTS: Symmetric in size. No masses, skin changes, nipple drainage, or lymphadenopathy. ABDOMEN: Soft, normal bowel sounds, no distention noted.  No  tenderness, rebound or guarding.  BLADDER: Normal PELVIC:  Bladder no bladder distension noted  Urethra: normal appearing urethra with no masses, tenderness or lesions  Vulva: normal appearing vulva with no masses, tenderness or lesions  Vagina: normal appearing vagina with normal color and discharge, no lesions  Cervix: normal appearing cervix without discharge or lesions  Uterus: uterus is normal size, shape, consistency and nontender  Adnexa: normal adnexa in size, nontender and no masses  RV: External Exam NormaI, No Rectal Masses, and Normal Sphincter tone  MUSCULOSKELETAL: Normal range of motion. No tenderness.  No cyanosis, clubbing, or edema.  2+ distal pulses. LYMPHATIC: No Axillary, Supraclavicular, or Inguinal Adenopathy.   Labs: Labs reviewed in Care Everywhere  Assessment:   1. Encounter for well woman exam with routine gynecological exam   2. Encounter for screening mammogram for malignant neoplasm of breast   3.  Cervical cancer screening   4. Colon cancer screening      Plan:  Pap: Performed today. Can re-screen in 3-5 years.  Mammogram: Ordered.  Colon Screening:   Patient due for screening, will have order placed by PCP.  Labs:  Reviewed in Care Everywhere (ordered by PCP).  Routine preventative health maintenance measures emphasized: Exercise/Diet/Weight control, Tobacco Warnings, Alcohol/Substance use risks, Stress Management.   Prediabetes and HTN managed by PCP Menopause, asymptomatic.  COVID Vaccination status: eligible for booster Return to Clinic - 1 Year   Archie Savers, MD [facility]

## 2023-07-27 ENCOUNTER — Encounter: Payer: Self-pay | Admitting: Obstetrics and Gynecology

## 2023-07-27 ENCOUNTER — Ambulatory Visit (INDEPENDENT_AMBULATORY_CARE_PROVIDER_SITE_OTHER): Payer: Managed Care, Other (non HMO) | Admitting: Obstetrics and Gynecology

## 2023-07-27 VITALS — BP 111/74 | HR 88 | Ht 62.0 in | Wt 192.9 lb

## 2023-07-27 DIAGNOSIS — Z1211 Encounter for screening for malignant neoplasm of colon: Secondary | ICD-10-CM

## 2023-07-27 DIAGNOSIS — Z01419 Encounter for gynecological examination (general) (routine) without abnormal findings: Secondary | ICD-10-CM | POA: Diagnosis not present

## 2023-07-27 DIAGNOSIS — Z1231 Encounter for screening mammogram for malignant neoplasm of breast: Secondary | ICD-10-CM

## 2023-07-27 DIAGNOSIS — Z124 Encounter for screening for malignant neoplasm of cervix: Secondary | ICD-10-CM

## 2023-07-27 NOTE — Addendum Note (Signed)
 Addended by: Kathlene Cote on: 07/27/2023 10:37 AM   Modules accepted: Orders

## 2023-07-27 NOTE — Patient Instructions (Addendum)
 Preventive Care 9-61 Years Old, Female Preventive care refers to lifestyle choices and visits with your health care provider that can promote health and wellness. Preventive care visits are also called wellness exams. What can I expect for my preventive care visit? Counseling Your health care provider may ask you questions about your: Medical history, including: Past medical problems. Family medical history. Pregnancy history. Current health, including: Menstrual cycle. Method of birth control. Emotional well-being. Home life and relationship well-being. Sexual activity and sexual health. Lifestyle, including: Alcohol, nicotine or tobacco, and drug use. Access to firearms. Diet, exercise, and sleep habits. Work and work Astronomer. Sunscreen use. Safety issues such as seatbelt and bike helmet use. Physical exam Your health care provider will check your: Height and weight. These may be used to calculate your BMI (body mass index). BMI is a measurement that tells if you are at a healthy weight. Waist circumference. This measures the distance around your waistline. This measurement also tells if you are at a healthy weight and may help predict your risk of certain diseases, such as type 2 diabetes and high blood pressure. Heart rate and blood pressure. Body temperature. Skin for abnormal spots. What immunizations do I need?  Vaccines are usually given at various ages, according to a schedule. Your health care provider will recommend vaccines for you based on your age, medical history, and lifestyle or other factors, such as travel or where you work. What tests do I need? Screening Your health care provider may recommend screening tests for certain conditions. This may include: Lipid and cholesterol levels. Diabetes screening. This is done by checking your blood sugar (glucose) after you have not eaten for a while (fasting). Pelvic exam and Pap test. Hepatitis B test. Hepatitis C  test. HIV (human immunodeficiency virus) test. STI (sexually transmitted infection) testing, if you are at risk. Lung cancer screening. Colorectal cancer screening. Mammogram. Talk with your health care provider about when you should start having regular mammograms. This may depend on whether you have a family history of breast cancer. BRCA-related cancer screening. This may be done if you have a family history of breast, ovarian, tubal, or peritoneal cancers. Bone density scan. This is done to screen for osteoporosis. Talk with your health care provider about your test results, treatment options, and if necessary, the need for more tests. Follow these instructions at home: Eating and drinking  Eat a diet that includes fresh fruits and vegetables, whole grains, lean protein, and low-fat dairy products. Take vitamin and mineral supplements as recommended by your health care provider. Do not drink alcohol if: Your health care provider tells you not to drink. You are pregnant, may be pregnant, or are planning to become pregnant. If you drink alcohol: Limit how much you have to 0-1 drink a day. Know how much alcohol is in your drink. In the [place] one drink equals one 12 oz bottle of beer (355 mL), one 5 oz glass of wine (148 mL), or one 1 oz glass of hard liquor (44 mL). Lifestyle Brush your teeth every morning and night with fluoride toothpaste. Floss one time each day. Exercise for at least 30 minutes 5 or more days each week. Do not use any products that contain nicotine or tobacco. These products include cigarettes, chewing tobacco, and vaping devices, such as e-cigarettes. If you need help quitting, ask your health care provider. Do not use drugs. If you are sexually active, practice safe sex. Use a condom or other form of protection to  prevent STIs. If you do not wish to become pregnant, use a form of birth control. If you plan to become pregnant, see your health care provider for a  prepregnancy visit. Take aspirin only as told by your health care provider. Make sure that you understand how much to take and what form to take. Work with your health care provider to find out whether it is safe and beneficial for you to take aspirin daily. Find healthy ways to manage stress, such as: Meditation, yoga, or listening to music. Journaling. Talking to a trusted person. Spending time with friends and family. Minimize exposure to UV radiation to reduce your risk of skin cancer. Safety Always wear your seat belt while driving or riding in a vehicle. Do not drive: If you have been drinking alcohol. Do not ride with someone who has been drinking. When you are tired or distracted. While texting. If you have been using any mind-altering substances or drugs. Wear a helmet and other protective equipment during sports activities. If you have firearms in your house, make sure you follow all gun safety procedures. Seek help if you have been physically or sexually abused. What's next? Visit your health care provider once a year for an annual wellness visit. Ask your health care provider how often you should have your eyes and teeth checked. Stay up to date on all vaccines. This information is not intended to replace advice given to you by your health care provider. Make sure you discuss any questions you have with your health care provider. Document Revised: 12/30/2020 Document Reviewed: 12/30/2020 Elsevier Patient Education  2024 Else[employer] Inc.     Breast Self-Awareness Breast self-awareness means being familiar with how your breasts look and feel. It involves checking your breasts regularly and telling your health care provider about any changes. Practicing breast self-awareness helps to maintain breast health. Sometimes, changes are not harmful (are benign). Other times, a change in your breasts can be a sign of a serious medical problem. Being familiar with the look and feel of  your breasts can help you catch a breast problem while it is still small and can be treated. You should do breast self-exams even if you have breast implants. What you need: A mirror. A well-lit room. A pillow or other soft object. How to do a breast self-exam A breast self-exam is one way to learn what is normal for your breasts and whether your breasts are changing. To do a breast self-exam: Look for changes  Remove all the clothing above your waist. Stand in front of a mirror in a room with good lighting. Put your hands down at your sides. Compare your breasts in the mirror. Look for differences between them (asymmetry), such as: Differences in shape. Differences in size. Puckers, dips, and bumps in one breast and not the other. Look at each breast for changes in the skin, such as: Redness. Scaly areas. Skin thickening. Dimpling. Open sores (ulcers). Look for changes in your nipples, such as: Discharge. Bleeding. Dimpling. Redness. A nipple that looks pushed in (retracted), or that has changed position. Feel for changes Carefully feel your breasts for lumps and changes. It is best to do this self-exam while lying down. Follow these steps to feel each breast: Place a pillow under the shoulder of one side of your body. Place the arm of that side of your body behind your head. Feel the breast of that side of your body using the hand of the opposite arm. To do  this: Start in the nipple area and use the pads of your three middle fingers to make -inch (2 cm) overlapping circles. Use light, medium, and then firm pressure as you feel your breast, gently covering the entire breast area and armpit. Continue the overlapping circles, moving downward over the breast until you feel your ribs below your breast. Then, make circles with your fingers going upward until you reach your collarbone. Next, make circles by moving outward across your breast and into your armpit area. Squeeze the  nipple. Check for discharge and lumps. Repeat steps 1-7 to check your other breast. Sit or stand in the tub or shower. With soapy water on your skin, feel each breast the same way you did when you were lying down. Write down what you find Writing down what you find can help you remember what to discuss with your health care provider. Write down: What is normal for each breast. Any changes that you find in each breast. These include: The kind of changes you find. Any pain or tenderness. Size and location of any lumps. Where you are in your menstrual cycle, if you are still getting your menstrual period (menstruating). General tips If you are breastfeeding, the best time to examine your breasts is after a feeding or after using a breast pump. If you menstruate, the best time to examine your breasts is 5-7 days after your menstrual period. Breasts are generally lumpier during menstrual periods, and it may be more difficult to notice changes. With time and practice, you will become more familiar with the differences in your breasts and more comfortable with the exam. Contact a health care provider if: You see a change in the shape or size of your breasts or nipples. You see a change in the skin of your breast or nipples, such as a reddened or scaly area. You have unusual discharge from your nipples. You find a new lump or thick area. You have breast pain. You have any concerns about your breast health. Summary Breast self-awareness includes looking for physical changes in your breasts and feeling for any changes within your breasts. Breast self-awareness should be done in front of a mirror in a well-lit room. If you menstruate, the best time to examine your breasts is 5-7 days after your menstrual period. Tell your health care provider about any changes you notice in your breasts. Changes include changes in size, changes on the skin, pain or tenderness, or unusual fluid from your  nipples. This information is not intended to replace advice given to you by your health care provider. Make sure you discuss any questions you have with your health care provider. Document Revised: 12/09/2021 Document Reviewed: 05/06/2021 Elsevier Patient Education  2024 [employer].

## 2023-07-27 NOTE — Addendum Note (Signed)
 Addended by: Fabian November on: 07/27/2023 10:55 AM   Modules accepted: Orders

## 2023-07-31 ENCOUNTER — Other Ambulatory Visit: Payer: Self-pay | Admitting: Cardiology

## 2023-07-31 DIAGNOSIS — R7303 Prediabetes: Secondary | ICD-10-CM

## 2023-08-03 ENCOUNTER — Other Ambulatory Visit: Payer: Self-pay | Admitting: Cardiology

## 2023-08-03 ENCOUNTER — Telehealth: Payer: Self-pay

## 2023-08-03 DIAGNOSIS — R7303 Prediabetes: Secondary | ICD-10-CM

## 2023-08-03 LAB — IGP, COBASHPV16/18
HPV 16: NEGATIVE
HPV 18: NEGATIVE
HPV other hr types: NEGATIVE

## 2023-08-03 NOTE — Telephone Encounter (Signed)
Pt lvm stating that she is needing a PA for her wegovy, has this been started?

## 2023-08-25 ENCOUNTER — Encounter: Payer: Self-pay | Admitting: Internal Medicine

## 2023-08-25 ENCOUNTER — Ambulatory Visit: Payer: Managed Care, Other (non HMO) | Admitting: Internal Medicine

## 2023-08-25 VITALS — BP 126/80 | HR 94 | Ht 62.0 in | Wt 191.0 lb

## 2023-08-25 DIAGNOSIS — E782 Mixed hyperlipidemia: Secondary | ICD-10-CM | POA: Diagnosis not present

## 2023-08-25 DIAGNOSIS — E66812 Obesity, class 2: Secondary | ICD-10-CM | POA: Diagnosis not present

## 2023-08-25 DIAGNOSIS — I10 Essential (primary) hypertension: Secondary | ICD-10-CM | POA: Diagnosis not present

## 2023-08-25 DIAGNOSIS — R7303 Prediabetes: Secondary | ICD-10-CM

## 2023-08-25 DIAGNOSIS — E6609 Other obesity due to excess calories: Secondary | ICD-10-CM

## 2023-08-25 DIAGNOSIS — Z6838 Body mass index (BMI) 38.0-38.9, adult: Secondary | ICD-10-CM

## 2023-08-25 MED ORDER — WEGOVY 1.7 MG/0.75ML ~~LOC~~ SOAJ: 1.7000 mg | SUBCUTANEOUS | 3 refills | Status: DC

## 2023-08-25 NOTE — Progress Notes (Signed)
 Established Patient Office Visit  Subjective:  Patient ID: Jasmine Buck, female    DOB: Dec 15, 1960  Age: 63 y.o. MRN: [id number]  Chief Complaint  Patient presents with   Follow-up    2 month follow up    Patient comes in for her follow-up today.  She is generally feeling well and has no new complaints.  She is using Wegovy  1 mg/week, and her weight loss has slowed down.  Will increase the dose to 1.7 mg/week.  Denies nausea, vomiting, no constipation.    No other concerns at this time.   Past Medical History:  Diagnosis Date   Fibroid    GERD (gastroesophageal reflux disease)    History of COVID-19    Hypertension    Mixed hyperlipidemia    Prediabetes     Past Surgical History:  Procedure Laterality Date   CESAREAN SECTION      Social History   Socioeconomic History   Marital status: Single    Spouse name: Not on file   Number of children: Not on file   Years of education: Not on file   Highest education level: Not on file  Occupational History   Not on file  Tobacco Use   Smoking status: Never   Smokeless tobacco: Never  Vaping Use   Vaping status: Never Used  Substance and Sexual Activity   Alcohol use: No   Drug use: No   Sexual activity: Yes  Other Topics Concern   Not on file  Social History Narrative   Not on file   Social Drivers of Health   Financial Resource Strain: Not on file  Food Insecurity: Not on file  Transportation Needs: Not on file  Physical Activity: Not on file  Stress: Not on file  Social Connections: Not on file  Intimate Partner Violence: Not on file    Family History  Problem Relation Age of Onset   Hypertension Mother    Hypertension Father     Allergies  Allergen Reactions   Ace Inhibitors Swelling    Outpatient Medications Prior to Visit  Medication Sig   amLODipine  (NORVASC ) 10 MG tablet Take 1 tablet (10 mg total) by mouth daily.   calcium carbonate (OSCAL) 1500 (600 Ca) MG TABS tablet Take 1,500 mg  by mouth 2 (two) times daily with a meal.   Cholecalciferol (D 1000) 25 MCG (1000 UT) capsule 1,000 Units daily.   co-enzyme Q-10 30 MG capsule Take 30 mg by mouth 3 (three) times daily.   metoprolol succinate (TOPROL-XL) 100 MG 24 hr tablet TAKE 1 TABLET BY MOUTH ONCE  DAILY   [DISCONTINUED] Semaglutide -Weight Management (WEGOVY ) 1 MG/0.5ML SOAJ INJECT 1MG  INTO THE SKIN ONCE A WEEK   Berberine Chloride 500 MG CAPS Take by mouth. (Patient not taking: Reported on 08/25/2023)   No facility-administered medications prior to visit.    Review of Systems  Constitutional: Negative.  Negative for chills, fever and malaise/fatigue.  HENT: Negative.    Eyes: Negative.   Respiratory: Negative.  Negative for cough and shortness of breath.   Cardiovascular: Negative.  Negative for chest pain, palpitations and leg swelling.  Gastrointestinal: Negative.  Negative for abdominal pain, constipation, diarrhea, heartburn, nausea and vomiting.  Genitourinary: Negative.  Negative for dysuria and flank pain.  Musculoskeletal: Negative.  Negative for joint pain and myalgias.  Skin: Negative.   Neurological: Negative.  Negative for dizziness and headaches.  Endo/Heme/Allergies: Negative.   Psychiatric/Behavioral: Negative.  Negative for depression and suicidal ideas.  The patient is not nervous/anxious.        Objective:   BP 126/80   Pulse 94   Ht 5' 2 (1.575 m)   Wt 191 lb (86.6 kg)   SpO2 98%   BMI 34.93 kg/m   Vitals:   08/25/23 0949  BP: 126/80  Pulse: 94  Height: 5' 2 (1.575 m)  Weight: 191 lb (86.6 kg)  SpO2: 98%  BMI (Calculated): 34.93    Physical Exam Vitals and nursing note reviewed.  Constitutional:      Appearance: Normal appearance.  HENT:     Head: Normocephalic and atraumatic.     Nose: Nose normal.     Mouth/Throat:     Mouth: Mucous membranes are moist.     Pharynx: Oropharynx is clear.  Eyes:     Conjunctiva/sclera: Conjunctivae normal.     Pupils: Pupils are  equal, round, and reactive to light.  Cardiovascular:     Rate and Rhythm: Normal rate and regular rhythm.     Pulses: Normal pulses.     Heart sounds: Normal heart sounds. No murmur heard. Pulmonary:     Effort: Pulmonary effort is normal.     Breath sounds: Normal breath sounds. No wheezing.  Abdominal:     General: Bowel sounds are normal.     Palpations: Abdomen is soft.     Tenderness: There is no abdominal tenderness. There is no right CVA tenderness or left CVA tenderness.  Musculoskeletal:        General: Normal range of motion.     Cervical back: Normal range of motion.     Right lower leg: No edema.     Left lower leg: No edema.  Skin:    General: Skin is warm and dry.  Neurological:     General: No focal deficit present.     Mental Status: She is alert and oriented to person, place, and time.  Psychiatric:        Mood and Affect: Mood normal.        Behavior: Behavior normal.      No results found for any visits on 08/25/23.  Recent Results (from the past 2160 hours)  CMP14+EGFR     Status: None   Collection Time: 06/23/23  9:48 AM  Result Value Ref Range   Glucose 83 70 - 99 mg/dL   BUN 11 8 - 27 mg/dL   Creatinine, Ser 9.12 0.57 - 1.00 mg/dL   eGFR 76 >40 fO/fpw/8.26   BUN/Creatinine Ratio 13 12 - 28   Sodium 137 134 - 144 mmol/L   Potassium 4.4 3.5 - 5.2 mmol/L   Chloride 102 96 - 106 mmol/L   CO2 21 20 - 29 mmol/L   Calcium 9.5 8.7 - 10.3 mg/dL   Total Protein 7.6 6.0 - 8.5 g/dL   Albumin 4.1 3.8 - 4.9 g/dL   Globulin, Total 3.5 1.5 - 4.5 g/dL   Bilirubin Total 0.3 0.0 - 1.2 mg/dL   Alkaline Phosphatase 110 44 - 121 IU/L   AST 12 0 - 40 IU/L   ALT 9 0 - 32 IU/L  Lipid Panel w/o Chol/HDL Ratio     Status: Abnormal   Collection Time: 06/23/23  9:48 AM  Result Value Ref Range   Cholesterol, Total 187 100 - 199 mg/dL   Triglycerides 44 0 - 149 mg/dL   HDL 63 >60 mg/dL   VLDL Cholesterol Cal 9 5 - 40 mg/dL   LDL Chol Calc (NIH)  115 (H) 0 - 99 mg/dL   Hemoglobin J8r     Status: Abnormal   Collection Time: 06/23/23  9:48 AM  Result Value Ref Range   Hgb A1c MFr Bld 6.0 (H) 4.8 - 5.6 %    Comment:          Prediabetes: 5.7 - 6.4          Diabetes: >6.4          Glycemic control for adults with diabetes: <7.0    Est. average glucose Bld gHb Est-mCnc 126 mg/dL  IGP, [id number]     Status: None   Collection Time: 07/27/23 11:06 AM  Result Value Ref Range   Interpretation NILM     Comment: NEGATIVE FOR INTRAEPITHELIAL LESION OR MALIGNANCY.   Category NIL     Comment: Negative for Intraepithelial Lesion   Infection FOC     Comment: FUNGAL ORGANISMS MORPHOLOGICALLY CONSISTENT WITH CANDIDA SPECIES ARE PRESENT.    Adequacy SECNI     Comment: Satisfactory for evaluation. No endocervical component is identified.   Clinician Provided ICD10 Comment     Comment: Z12.4   Performed by: Comment     Comment: Schkita Black, Cytotechnologist (ASCP)   Note: Comment     Comment: The Pap smear is a screening test designed to aid in the detection of premalignant and malignant conditions of the uterine cervix.  It is not a diagnostic procedure and should not be used as the sole means of detecting cervical cancer.  Both false-positive and false-negative reports do occur.    Test Methodology Comment     Comment: This liquid based ThinPrep(R) pap test was screened with the use of an image guided system.    HPV other hr types Negative Negative   HPV 16 Negative Negative   HPV 18 Negative Negative    Comment: This nucleic acid amplification test detects fourteen high-risk HPV types: HPV16, HPV18 and twelve other high-risk types (68,66,64,60,54,48,47,43,41,40,33,31) without differentiation.       Assessment & Plan:  Increase dose of Wegovy  to 1.7 mg/week.  Strict diet control and exercise regimen emphasized. Continue rest of medications. Problem List Items Addressed This Visit     Essential hypertension, benign - Primary   Pre-diabetes    Mixed hyperlipidemia   Class 2 obesity due to excess calories without serious comorbidity with body mass index (BMI) of 38.0 to 38.9 in adult   Relevant Medications   Semaglutide -Weight Management (WEGOVY ) 1.7 MG/0.75ML SOAJ    Return in about 6 weeks (around 10/06/2023).   Total time spent: 25 minutes  FERNAND FREDY RAMAN, MD  08/25/2023   This document may have been prepared by [facility] Voice Recognition software and as such may include unintentional dictation errors.

## 2023-09-01 ENCOUNTER — Ambulatory Visit
Admission: RE | Admit: 2023-09-01 | Discharge: 2023-09-01 | Disposition: A | Discharge status: Home / Self Care | Payer: Managed Care, Other (non HMO) | Source: Ambulatory Visit | Attending: Obstetrics and Gynecology | Admitting: Obstetrics and Gynecology

## 2023-09-01 DIAGNOSIS — Z1231 Encounter for screening mammogram for malignant neoplasm of breast: Secondary | ICD-10-CM | POA: Diagnosis present

## 2023-09-01 DIAGNOSIS — Z01419 Encounter for gynecological examination (general) (routine) without abnormal findings: Secondary | ICD-10-CM

## 2023-09-05 ENCOUNTER — Encounter: Payer: Self-pay | Admitting: Obstetrics and Gynecology

## 2023-10-06 ENCOUNTER — Ambulatory Visit: Payer: Managed Care, Other (non HMO) | Admitting: Internal Medicine

## 2023-10-06 ENCOUNTER — Encounter: Payer: Self-pay | Admitting: Internal Medicine

## 2023-10-06 VITALS — BP 102/72 | HR 63 | Ht 62.0 in | Wt 189.6 lb

## 2023-10-06 DIAGNOSIS — E782 Mixed hyperlipidemia: Secondary | ICD-10-CM | POA: Diagnosis not present

## 2023-10-06 DIAGNOSIS — E6609 Other obesity due to excess calories: Secondary | ICD-10-CM

## 2023-10-06 DIAGNOSIS — I10 Essential (primary) hypertension: Secondary | ICD-10-CM | POA: Diagnosis not present

## 2023-10-06 DIAGNOSIS — Z6838 Body mass index (BMI) 38.0-38.9, adult: Secondary | ICD-10-CM

## 2023-10-06 DIAGNOSIS — R7303 Prediabetes: Secondary | ICD-10-CM | POA: Diagnosis not present

## 2023-10-06 DIAGNOSIS — E66812 Obesity, class 2: Secondary | ICD-10-CM

## 2023-10-06 MED ORDER — WEGOVY 1.7 MG/0.75ML ~~LOC~~ SOAJ: 1.7000 mg | SUBCUTANEOUS | 3 refills | Status: DC

## 2023-10-06 NOTE — Progress Notes (Signed)
 Established Patient Office Visit  Subjective:  Patient ID: Jasmine Buck, female    DOB: [birth year]  Age: 63 y.o. MRN: [id number]  Chief Complaint  Patient presents with   Follow-up    6 week follow up    Patient comes in for her follow-up today.  She is doing well and has lost more weight on Wegovy 1.7 mg/week.  Denies nausea, vomiting, abdominal pain.  She does have mild constipation but she can take care of that.  Would like to continue at the current dose of 1.7 mg/week.    No other concerns at this time.   Past Medical History:  Diagnosis Date   Fibroid    GERD (gastroesophageal reflux disease)    History of COVID-19    Hypertension    Mixed hyperlipidemia    Prediabetes     Past Surgical History:  Procedure Laterality Date   CESAREAN SECTION      Social History   Socioeconomic History   Marital status: Single    Spouse name: Not on file   Number of children: Not on file   Years of education: Not on file   Highest education level: Not on file  Occupational History   Not on file  Tobacco Use   Smoking status: Never   Smokeless tobacco: Never  Vaping Use   Vaping status: Never Used  Substance and Sexual Activity   Alcohol use: No   Drug use: No   Sexual activity: Yes  Other Topics Concern   Not on file  Social History Narrative   Not on file   Social Drivers of Health   Financial Resource Strain: Not on file  Food Insecurity: Not on file  Transportation Needs: Not on file  Physical Activity: Not on file  Stress: Not on file  Social Connections: Not on file  Intimate Partner Violence: Not on file    Family History  Problem Relation Age of Onset   Hypertension Mother    Hypertension Father    Breast cancer Neg Hx     Allergies  Allergen Reactions   Ace Inhibitors Swelling    Outpatient Medications Prior to Visit  Medication Sig   amLODipine (NORVASC) 10 MG tablet Take 1 tablet (10 mg total) by mouth daily.   calcium carbonate  (OSCAL) 1500 (600 Ca) MG TABS tablet Take 1,500 mg by mouth 2 (two) times daily with a meal.   Cholecalciferol (D 1000) 25 MCG (1000 UT) capsule 1,000 Units daily.   co-enzyme Q-10 30 MG capsule Take 30 mg by mouth 3 (three) times daily.   metoprolol succinate (TOPROL-XL) 100 MG 24 hr tablet TAKE 1 TABLET BY MOUTH ONCE  DAILY   [DISCONTINUED] Semaglutide-Weight Management (WEGOVY) 1.7 MG/0.75ML SOAJ Inject 1.7 mg into the skin once a week.   Berberine Chloride 500 MG CAPS Take by mouth. (Patient not taking: Reported on 10/06/2023)   No facility-administered medications prior to visit.    Review of Systems  Constitutional: Negative.  Negative for chills, fever and weight loss.  HENT: Negative.  Negative for sore throat.   Eyes: Negative.   Respiratory: Negative.  Negative for cough and shortness of breath.   Cardiovascular: Negative.  Negative for chest pain, palpitations and leg swelling.  Gastrointestinal: Negative.  Negative for abdominal pain, constipation, diarrhea, heartburn, nausea and vomiting.  Genitourinary: Negative.  Negative for dysuria and flank pain.  Musculoskeletal: Negative.  Negative for joint pain and myalgias.  Skin: Negative.   Neurological: Negative.  Negative for dizziness, tingling, tremors, sensory change and headaches.  Endo/Heme/Allergies: Negative.   Psychiatric/Behavioral: Negative.  Negative for depression and suicidal ideas. The patient is not nervous/anxious.        Objective:   BP 102/72   Pulse 63   Ht 5\' 2"  (1.575 m)   Wt 189 lb 9.6 oz (86 kg)   SpO2 99%   BMI 34.68 kg/m   Vitals:   10/06/23 0938  BP: 102/72  Pulse: 63  Height: 5\' 2"  (1.575 m)  Weight: 189 lb 9.6 oz (86 kg)  SpO2: 99%  BMI (Calculated): 34.67    Physical Exam Vitals and nursing note reviewed.  Constitutional:      Appearance: Normal appearance.  HENT:     Head: Normocephalic and atraumatic.     Nose: Nose normal.     Mouth/Throat:     Mouth: Mucous membranes are  moist.     Pharynx: Oropharynx is clear.  Eyes:     Conjunctiva/sclera: Conjunctivae normal.     Pupils: Pupils are equal, round, and reactive to light.  Cardiovascular:     Rate and Rhythm: Normal rate and regular rhythm.     Pulses: Normal pulses.     Heart sounds: Normal heart sounds. No murmur heard. Pulmonary:     Effort: Pulmonary effort is normal.     Breath sounds: Normal breath sounds. No wheezing.  Abdominal:     General: Bowel sounds are normal.     Palpations: Abdomen is soft.     Tenderness: There is no abdominal tenderness. There is no right CVA tenderness or left CVA tenderness.  Musculoskeletal:        General: Normal range of motion.     Cervical back: Normal range of motion.     Right lower leg: No edema.     Left lower leg: No edema.  Skin:    General: Skin is warm and dry.  Neurological:     General: No focal deficit present.     Mental Status: She is alert and oriented to person, place, and time.  Psychiatric:        Mood and Affect: Mood normal.        Behavior: Behavior normal.      No results found for any visits on 10/06/23.  Recent Results (from the past 2160 hours)  IGP, [id number]     Status: None   Collection Time: 07/27/23 11:06 AM  Result Value Ref Range   Interpretation NILM     Comment: NEGATIVE FOR INTRAEPITHELIAL LESION OR MALIGNANCY.   Category NIL     Comment: Negative for Intraepithelial Lesion   Infection FOC     Comment: FUNGAL ORGANISMS MORPHOLOGICALLY CONSISTENT WITH CANDIDA SPECIES ARE PRESENT.    Adequacy SECNI     Comment: Satisfactory for evaluation. No endocervical component is identified.   Clinician Provided ICD10 Comment     Comment: Z12.4   Performed by: Comment     Comment: Schkita Black, Cytotechnologist (ASCP)   Note: Comment     Comment: The Pap smear is a screening test designed to aid in the detection of premalignant and malignant conditions of the uterine cervix.  It is not a diagnostic procedure and  should not be used as the sole means of detecting cervical cancer.  Both false-positive and false-negative reports do occur.    Test Methodology Comment     Comment: This liquid based ThinPrep(R) pap test was screened with the use of an image guided  system.    HPV other hr types Negative Negative   HPV 16 Negative Negative   HPV 18 Negative Negative    Comment: This nucleic acid amplification test detects fourteen high-risk HPV types: HPV16, HPV18 and twelve other high-risk types (95,62,13,08,65,78,46,96,29,52,84,13) without differentiation.       Assessment & Plan:  Continue strict diet control and medications along with exercise. Problem List Items Addressed This Visit     Essential hypertension, benign - Primary   Pre-diabetes   Mixed hyperlipidemia   Class 2 obesity due to excess calories without serious comorbidity with body mass index (BMI) of 38.0 to 38.9 in adult   Relevant Medications   Semaglutide-Weight Management (WEGOVY) 1.7 MG/0.75ML SOAJ    Return in about 6 weeks (around 11/17/2023).   Total time spent: 25 minutes  Margaretann Loveless, MD  10/06/2023   This document may have been prepared by [facility] Voice Recognition software and as such may include unintentional dictation errors.

## 2023-11-17 ENCOUNTER — Ambulatory Visit: Admitting: Internal Medicine

## 2023-11-17 ENCOUNTER — Encounter: Payer: Self-pay | Admitting: Internal Medicine

## 2023-11-17 VITALS — BP 112/78 | HR 84 | Ht 62.0 in | Wt 188.4 lb

## 2023-11-17 DIAGNOSIS — I10 Essential (primary) hypertension: Secondary | ICD-10-CM

## 2023-11-17 DIAGNOSIS — R7303 Prediabetes: Secondary | ICD-10-CM

## 2023-11-17 DIAGNOSIS — E782 Mixed hyperlipidemia: Secondary | ICD-10-CM

## 2023-11-17 DIAGNOSIS — K219 Gastro-esophageal reflux disease without esophagitis: Secondary | ICD-10-CM | POA: Diagnosis not present

## 2023-11-17 NOTE — Progress Notes (Signed)
 Established Patient Office Visit  Subjective:  Patient ID: Jasmine Buck, female    DOB: Aug 13, 1960  Age: 63 y.o. MRN: [id number]  Chief Complaint  Patient presents with   Follow-up    6 week follow up    Patient comes in for her follow-up today.  She is generally feeling well and is tolerating all her medications.  Currently she is on Wegovy  1.7 mg/week and is tolerating it well.  She reports that at home she has lost more weight but not according to our scales.  However she wants to continue the same dose for another 4 weeks.  No complaints of nausea, vomiting no abdominal pain and no extra constipation. She is fasting for blood work.    No other concerns at this time.   Past Medical History:  Diagnosis Date   Fibroid    GERD (gastroesophageal reflux disease)    History of COVID-19    Hypertension    Mixed hyperlipidemia    Prediabetes     Past Surgical History:  Procedure Laterality Date   CESAREAN SECTION      Social History   Socioeconomic History   Marital status: Single    Spouse name: Not on file   Number of children: Not on file   Years of education: Not on file   Highest education level: Not on file  Occupational History   Not on file  Tobacco Use   Smoking status: Never   Smokeless tobacco: Never  Vaping Use   Vaping status: Never Used  Substance and Sexual Activity   Alcohol use: No   Drug use: No   Sexual activity: Yes  Other Topics Concern   Not on file  Social History Narrative   Not on file   Social Drivers of Health   Financial Resource Strain: Not on file  Food Insecurity: Not on file  Transportation Needs: Not on file  Physical Activity: Not on file  Stress: Not on file  Social Connections: Not on file  Intimate Partner Violence: Not on file    Family History  Problem Relation Age of Onset   Hypertension Mother    Hypertension Father    Breast cancer Neg Hx     Allergies  Allergen Reactions   Ace Inhibitors Swelling     Outpatient Medications Prior to Visit  Medication Sig   amLODipine  (NORVASC ) 10 MG tablet Take 1 tablet (10 mg total) by mouth daily.   calcium carbonate (OSCAL) 1500 (600 Ca) MG TABS tablet Take 1,500 mg by mouth 2 (two) times daily with a meal.   Cholecalciferol (D 1000) 25 MCG (1000 UT) capsule 1,000 Units daily.   co-enzyme Q-10 30 MG capsule Take 30 mg by mouth 3 (three) times daily.   metoprolol succinate (TOPROL-XL) 100 MG 24 hr tablet TAKE 1 TABLET BY MOUTH ONCE  DAILY   Semaglutide -Weight Management (WEGOVY ) 1.7 MG/0.75ML SOAJ Inject 1.7 mg into the skin once a week.   Berberine Chloride 500 MG CAPS Take by mouth. (Patient not taking: Reported on 11/17/2023)   No facility-administered medications prior to visit.    Review of Systems  Constitutional: Negative.  Negative for chills, fever, malaise/fatigue and weight loss.  HENT: Negative.  Negative for sore throat.   Eyes: Negative.   Respiratory: Negative.  Negative for cough and shortness of breath.   Cardiovascular: Negative.  Negative for chest pain, palpitations and leg swelling.  Gastrointestinal: Negative.  Negative for abdominal pain, constipation, diarrhea, heartburn, nausea and  vomiting.  Genitourinary: Negative.  Negative for dysuria and flank pain.  Musculoskeletal: Negative.  Negative for joint pain and myalgias.  Skin: Negative.   Neurological: Negative.  Negative for dizziness and headaches.  Endo/Heme/Allergies: Negative.   Psychiatric/Behavioral: Negative.  Negative for depression and suicidal ideas. The patient is not nervous/anxious.        Objective:   BP 112/78   Pulse 84   Ht 5\' 2"  (1.575 m)   Wt 188 lb 6.4 oz (85.5 kg)   SpO2 96%   BMI 34.46 kg/m   Vitals:   11/17/23 0917  BP: 112/78  Pulse: 84  Height: 5\' 2"  (1.575 m)  Weight: 188 lb 6.4 oz (85.5 kg)  SpO2: 96%  BMI (Calculated): 34.45    Physical Exam Vitals and nursing note reviewed.  Constitutional:      Appearance: Normal  appearance.  HENT:     Head: Normocephalic and atraumatic.     Nose: Nose normal.     Mouth/Throat:     Mouth: Mucous membranes are moist.     Pharynx: Oropharynx is clear.  Eyes:     Conjunctiva/sclera: Conjunctivae normal.     Pupils: Pupils are equal, round, and reactive to light.  Cardiovascular:     Rate and Rhythm: Normal rate and regular rhythm.     Pulses: Normal pulses.     Heart sounds: Normal heart sounds. No murmur heard. Pulmonary:     Effort: Pulmonary effort is normal.     Breath sounds: Normal breath sounds. No wheezing.  Abdominal:     General: Bowel sounds are normal.     Palpations: Abdomen is soft.     Tenderness: There is no abdominal tenderness. There is no right CVA tenderness or left CVA tenderness.  Musculoskeletal:        General: Normal range of motion.     Cervical back: Normal range of motion.     Right lower leg: No edema.     Left lower leg: No edema.  Skin:    General: Skin is warm and dry.  Neurological:     General: No focal deficit present.     Mental Status: She is alert and oriented to person, place, and time.  Psychiatric:        Mood and Affect: Mood normal.        Behavior: Behavior normal.      No results found for any visits on 11/17/23.  No results found for this or any previous visit (from the past 2160 hours).    Assessment & Plan:  Continue current medications.  Check labs today. Problem List Items Addressed This Visit     Essential hypertension, benign - Primary   Relevant Orders   CMP14+EGFR   Pre-diabetes   Relevant Orders   Hemoglobin A1c   Gastroesophageal reflux disease without esophagitis   Mixed hyperlipidemia   Relevant Orders   Lipid Panel w/o Chol/HDL Ratio    Return in about 6 weeks (around 12/29/2023).   Total time spent: 30 minutes  Aisha Hove, MD  11/17/2023   This document may have been prepared by [facility] Voice Recognition software and as such may include unintentional dictation errors.

## 2023-11-18 LAB — CMP14+EGFR
ALT: 12 IU/L (ref 0–32)
AST: 16 IU/L (ref 0–40)
Albumin: 4.4 g/dL (ref 3.8–4.9)
Alkaline Phosphatase: 115 IU/L (ref 44–121)
BUN/Creatinine Ratio: 11 — ABNORMAL LOW (ref 12–28)
BUN: 12 mg/dL (ref 8–27)
Bilirubin Total: 0.5 mg/dL (ref 0.0–1.2)
CO2: 20 mmol/L (ref 20–29)
Calcium: 9.6 mg/dL (ref 8.7–10.3)
Chloride: 103 mmol/L (ref 96–106)
Creatinine, Ser: 1.08 mg/dL — ABNORMAL HIGH (ref 0.57–1.00)
Globulin, Total: 3.4 g/dL (ref 1.5–4.5)
Glucose: 89 mg/dL (ref 70–99)
Potassium: 4.3 mmol/L (ref 3.5–5.2)
Sodium: 139 mmol/L (ref 134–144)
Total Protein: 7.8 g/dL (ref 6.0–8.5)
eGFR: 59 mL/min/{1.73_m2} — ABNORMAL LOW (ref >59)

## 2023-11-18 LAB — HEMOGLOBIN A1C
Est. average glucose Bld gHb Est-mCnc: 120 mg/dL
Hgb A1c MFr Bld: 5.8 % — ABNORMAL HIGH (ref 4.8–5.6)

## 2023-11-18 LAB — LIPID PANEL W/O CHOL/HDL RATIO
Cholesterol, Total: 198 mg/dL (ref 100–199)
HDL: 69 mg/dL (ref >39)
LDL Chol Calc (NIH): 121 mg/dL — ABNORMAL HIGH (ref 0–99)
Triglycerides: 44 mg/dL (ref 0–149)
VLDL Cholesterol Cal: 8 mg/dL (ref 5–40)

## 2023-11-23 NOTE — Progress Notes (Signed)
 Patient notified

## 2023-12-26 ENCOUNTER — Other Ambulatory Visit: Payer: Self-pay

## 2023-12-26 DIAGNOSIS — I10 Essential (primary) hypertension: Secondary | ICD-10-CM

## 2023-12-26 MED ORDER — AMLODIPINE BESYLATE 10 MG PO TABS: 10.0000 mg | ORAL_TABLET | Freq: Every day | ORAL | 3 refills | Status: AC

## 2023-12-28 ENCOUNTER — Ambulatory Visit: Admitting: Internal Medicine

## 2023-12-28 ENCOUNTER — Encounter: Payer: Self-pay | Admitting: Internal Medicine

## 2023-12-28 VITALS — BP 114/82 | HR 85 | Ht 62.0 in | Wt 190.6 lb

## 2023-12-28 DIAGNOSIS — E66812 Obesity, class 2: Secondary | ICD-10-CM

## 2023-12-28 DIAGNOSIS — R7303 Prediabetes: Secondary | ICD-10-CM

## 2023-12-28 DIAGNOSIS — I10 Essential (primary) hypertension: Secondary | ICD-10-CM | POA: Diagnosis not present

## 2023-12-28 DIAGNOSIS — E782 Mixed hyperlipidemia: Secondary | ICD-10-CM

## 2023-12-28 DIAGNOSIS — E6609 Other obesity due to excess calories: Secondary | ICD-10-CM

## 2023-12-28 DIAGNOSIS — Z6838 Body mass index (BMI) 38.0-38.9, adult: Secondary | ICD-10-CM

## 2023-12-28 MED ORDER — WEGOVY 2.4 MG/0.75ML ~~LOC~~ SOAJ: 2.4000 mg | SUBCUTANEOUS | 3 refills | Status: DC

## 2023-12-28 NOTE — Progress Notes (Signed)
 Established Patient Office Visit  Subjective:  Patient ID: Jasmine Buck, female    DOB: 02/01/1961  Age: 63 y.o. MRN: [id number]  Chief Complaint  Patient presents with   Follow-up    6 week follow up    Patient comes in for her follow-up today.  She is generally feeling well and has no new complaints.  She is currently on Wegovy  1.7 mg/week and has not lost any significant weight.  Will increase to 2.4 mg/week.  Denies nausea or vomiting, no abdominal pain and no constipation.    No other concerns at this time.   Past Medical History:  Diagnosis Date   Fibroid    GERD (gastroesophageal reflux disease)    History of COVID-19    Hypertension    Mixed hyperlipidemia    Prediabetes     Past Surgical History:  Procedure Laterality Date   CESAREAN SECTION      Social History   Socioeconomic History   Marital status: Single    Spouse name: Not on file   Number of children: Not on file   Years of education: Not on file   Highest education level: Not on file  Occupational History   Not on file  Tobacco Use   Smoking status: Never   Smokeless tobacco: Never  Vaping Use   Vaping status: Never Used  Substance and Sexual Activity   Alcohol use: No   Drug use: No   Sexual activity: Yes  Other Topics Concern   Not on file  Social History Narrative   Not on file   Social Drivers of Health   Financial Resource Strain: Not on file  Food Insecurity: Not on file  Transportation Needs: Not on file  Physical Activity: Not on file  Stress: Not on file  Social Connections: Not on file  Intimate Partner Violence: Not on file    Family History  Problem Relation Age of Onset   Hypertension Mother    Hypertension Father    Breast cancer Neg Hx     Allergies  Allergen Reactions   Ace Inhibitors Swelling    Outpatient Medications Prior to Visit  Medication Sig   amLODipine  (NORVASC ) 10 MG tablet Take 1 tablet (10 mg total) by mouth daily.   calcium carbonate  (OSCAL) 1500 (600 Ca) MG TABS tablet Take 1,500 mg by mouth 2 (two) times daily with a meal.   Cholecalciferol (D 1000) 25 MCG (1000 UT) capsule 1,000 Units daily.   co-enzyme Q-10 30 MG capsule Take 30 mg by mouth 3 (three) times daily.   metoprolol succinate (TOPROL-XL) 100 MG 24 hr tablet TAKE 1 TABLET BY MOUTH ONCE  DAILY   [DISCONTINUED] Semaglutide -Weight Management (WEGOVY ) 1.7 MG/0.75ML SOAJ Inject 1.7 mg into the skin once a week.   Berberine Chloride 500 MG CAPS Take by mouth. (Patient not taking: Reported on 12/28/2023)   No facility-administered medications prior to visit.    Review of Systems  Constitutional: Negative.  Negative for chills, fever and weight loss.  HENT: Negative.  Negative for sore throat.   Eyes: Negative.   Respiratory: Negative.  Negative for cough and shortness of breath.   Cardiovascular: Negative.  Negative for chest pain, palpitations and leg swelling.  Gastrointestinal: Negative.  Negative for abdominal pain, constipation, diarrhea, heartburn, nausea and vomiting.  Genitourinary: Negative.  Negative for dysuria and flank pain.  Musculoskeletal: Negative.  Negative for joint pain and myalgias.  Skin: Negative.   Neurological: Negative.  Negative for  dizziness, tingling, tremors and headaches.  Endo/Heme/Allergies: Negative.   Psychiatric/Behavioral: Negative.  Negative for depression and suicidal ideas. The patient is not nervous/anxious.        Objective:   BP 114/82   Pulse 85   Ht 5' 2 (1.575 m)   Wt 190 lb 9.6 oz (86.5 kg)   SpO2 98%   BMI 34.86 kg/m   Vitals:   12/28/23 1143  BP: 114/82  Pulse: 85  Height: 5' 2 (1.575 m)  Weight: 190 lb 9.6 oz (86.5 kg)  SpO2: 98%  BMI (Calculated): 34.85    Physical Exam Vitals and nursing note reviewed.  Constitutional:      Appearance: Normal appearance.  HENT:     Head: Normocephalic and atraumatic.     Nose: Nose normal.     Mouth/Throat:     Mouth: Mucous membranes are moist.      Pharynx: Oropharynx is clear.   Eyes:     Conjunctiva/sclera: Conjunctivae normal.     Pupils: Pupils are equal, round, and reactive to light.    Cardiovascular:     Rate and Rhythm: Normal rate and regular rhythm.     Pulses: Normal pulses.     Heart sounds: Normal heart sounds. No murmur heard. Pulmonary:     Effort: Pulmonary effort is normal.     Breath sounds: Normal breath sounds. No wheezing.  Abdominal:     General: Bowel sounds are normal.     Palpations: Abdomen is soft.     Tenderness: There is no abdominal tenderness. There is no right CVA tenderness or left CVA tenderness.   Musculoskeletal:        General: Normal range of motion.     Cervical back: Normal range of motion.     Right lower leg: No edema.     Left lower leg: No edema.   Skin:    General: Skin is warm and dry.   Neurological:     General: No focal deficit present.     Mental Status: She is alert and oriented to person, place, and time.   Psychiatric:        Mood and Affect: Mood normal.        Behavior: Behavior normal.      No results found for any visits on 12/28/23.  Recent Results (from the past 2160 hours)  CMP14+EGFR     Status: Abnormal   Collection Time: 11/17/23  9:34 AM  Result Value Ref Range   Glucose 89 70 - 99 mg/dL   BUN 12 8 - 27 mg/dL   Creatinine, Ser 7.82 (H) 0.57 - 1.00 mg/dL   eGFR 59 (L) >95 AO/ZHY/8.65   BUN/Creatinine Ratio 11 (L) 12 - 28   Sodium 139 134 - 144 mmol/L   Potassium 4.3 3.5 - 5.2 mmol/L   Chloride 103 96 - 106 mmol/L   CO2 20 20 - 29 mmol/L   Calcium 9.6 8.7 - 10.3 mg/dL   Total Protein 7.8 6.0 - 8.5 g/dL   Albumin 4.4 3.8 - 4.9 g/dL   Globulin, Total 3.4 1.5 - 4.5 g/dL   Bilirubin Total 0.5 0.0 - 1.2 mg/dL   Alkaline Phosphatase 115 44 - 121 IU/L   AST 16 0 - 40 IU/L   ALT 12 0 - 32 IU/L  Lipid Panel w/o Chol/HDL Ratio     Status: Abnormal   Collection Time: 11/17/23  9:34 AM  Result Value Ref Range   Cholesterol, Total 198 100 -  199  mg/dL   Triglycerides 44 0 - 149 mg/dL   HDL 69 >16 mg/dL   VLDL Cholesterol Cal 8 5 - 40 mg/dL   LDL Chol Calc (NIH) 109 (H) 0 - 99 mg/dL  Hemoglobin U0A     Status: Abnormal   Collection Time: 11/17/23  9:34 AM  Result Value Ref Range   Hgb A1c MFr Bld 5.8 (H) 4.8 - 5.6 %    Comment:          Prediabetes: 5.7 - 6.4          Diabetes: >6.4          Glycemic control for adults with diabetes: <7.0    Est. average glucose Bld gHb Est-mCnc 120 mg/dL      Assessment & Plan:  Increase dose of Wegovy .  Strict diet control emphasized.  Continue other medications. Problem List Items Addressed This Visit     Essential hypertension, benign - Primary   Pre-diabetes   Relevant Medications   Semaglutide -Weight Management (WEGOVY ) 2.4 MG/0.75ML SOAJ   Mixed hyperlipidemia   Class 2 obesity due to excess calories without serious comorbidity with body mass index (BMI) of 38.0 to 38.9 in adult   Relevant Medications   Semaglutide -Weight Management (WEGOVY ) 2.4 MG/0.75ML SOAJ    Return in about 2 months (around 02/27/2024).   Total time spent: 30 minutes  Aisha Hove, MD  12/28/2023   This document may have been prepared by [facility] Voice Recognition software and as such may include unintentional dictation errors.

## 2024-01-24 IMAGING — MG MM DIGITAL SCREENING BILAT W/ TOMO AND CAD
6 of 10 series · 6 of 30 positions shown · non-contrast
Comparison: Previous exam(s).

CLINICAL DATA: Screening.

EXAM:
DIGITAL SCREENING BILATERAL MAMMOGRAM WITH TOMOSYNTHESIS AND CAD
TECHNIQUE: Bilateral screening digital craniocaudal and mediolateral oblique
mammograms were obtained. Bilateral screening digital breast
tomosynthesis was performed. The images were evaluated with
computer-aided detection.

[R CC synth-2D]
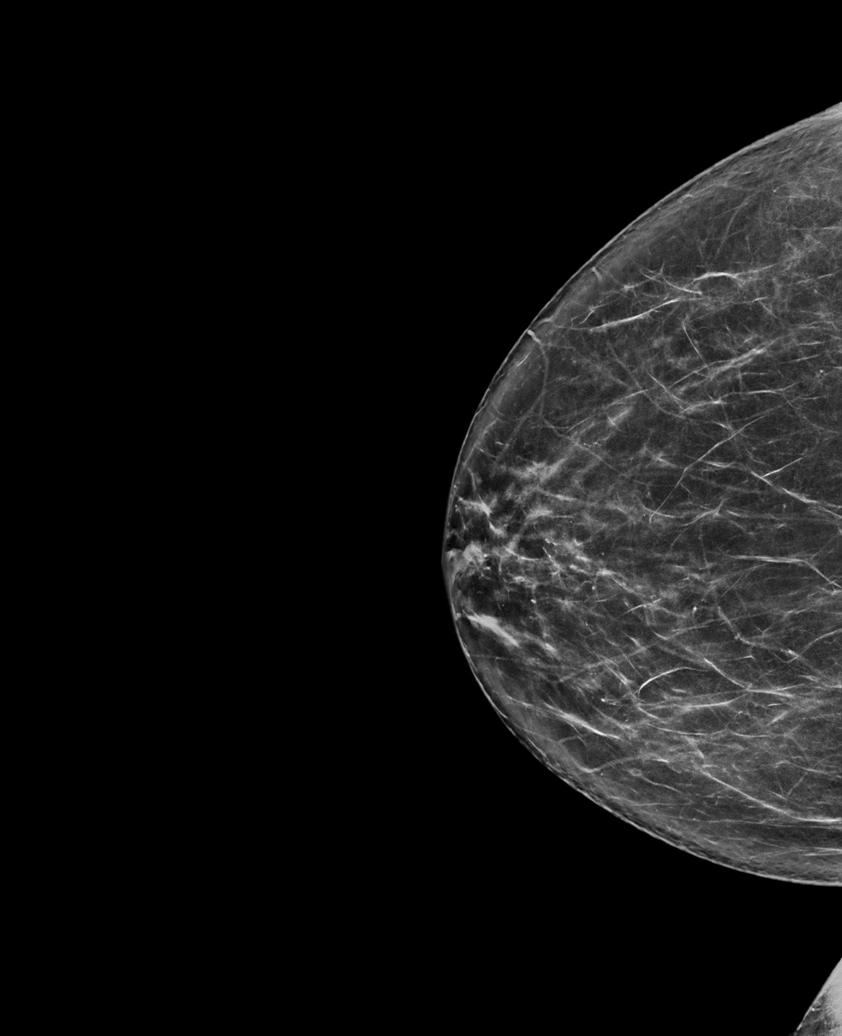

[L MLO synth-2D (1 of 2)]
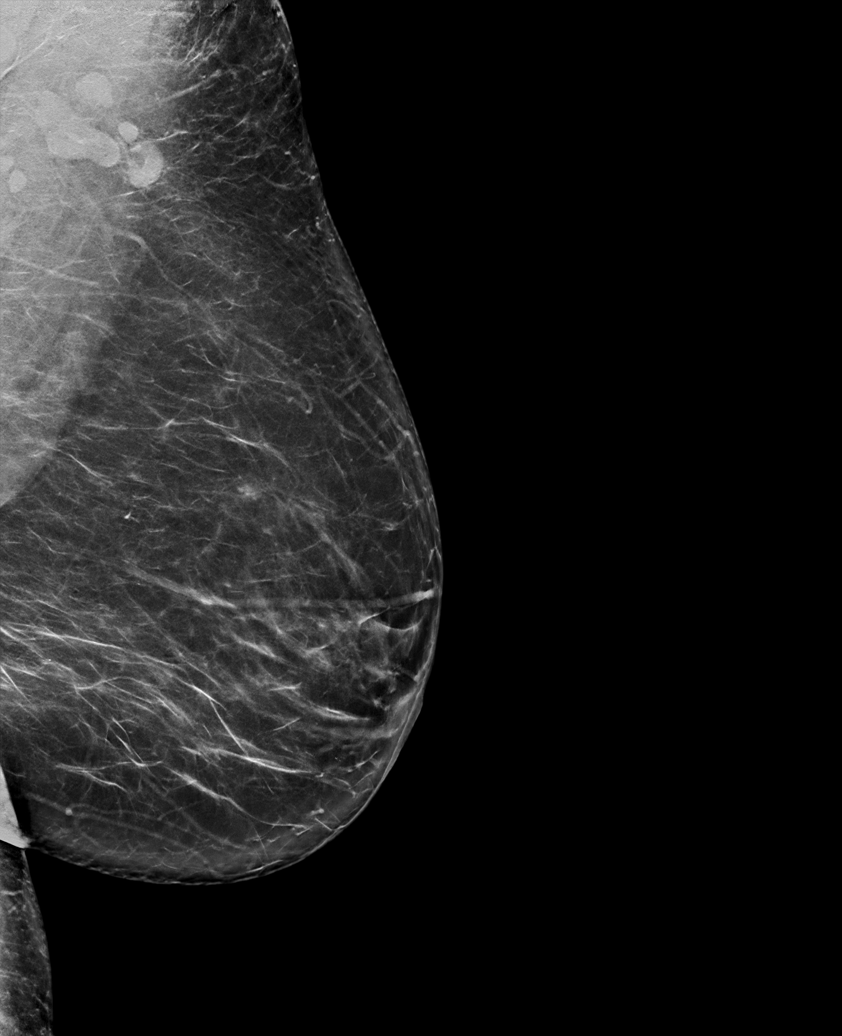

[R MLO synth-2D]
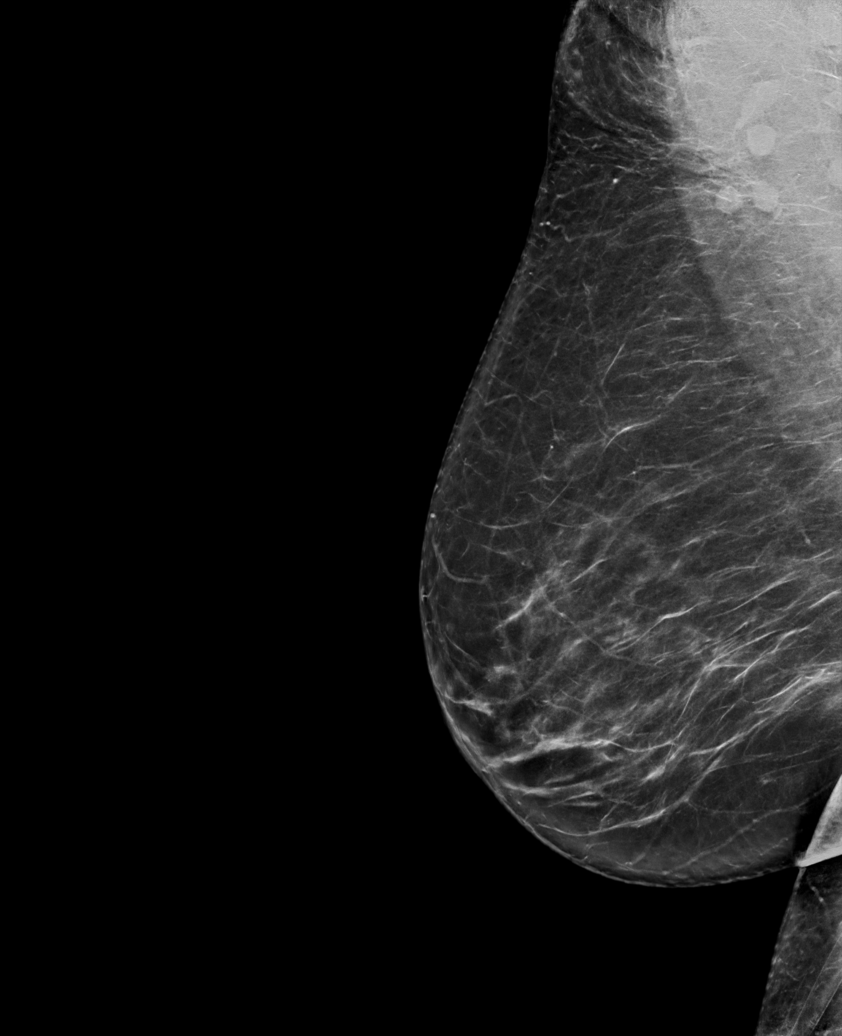

[L MLO synth-2D (2 of 2)]
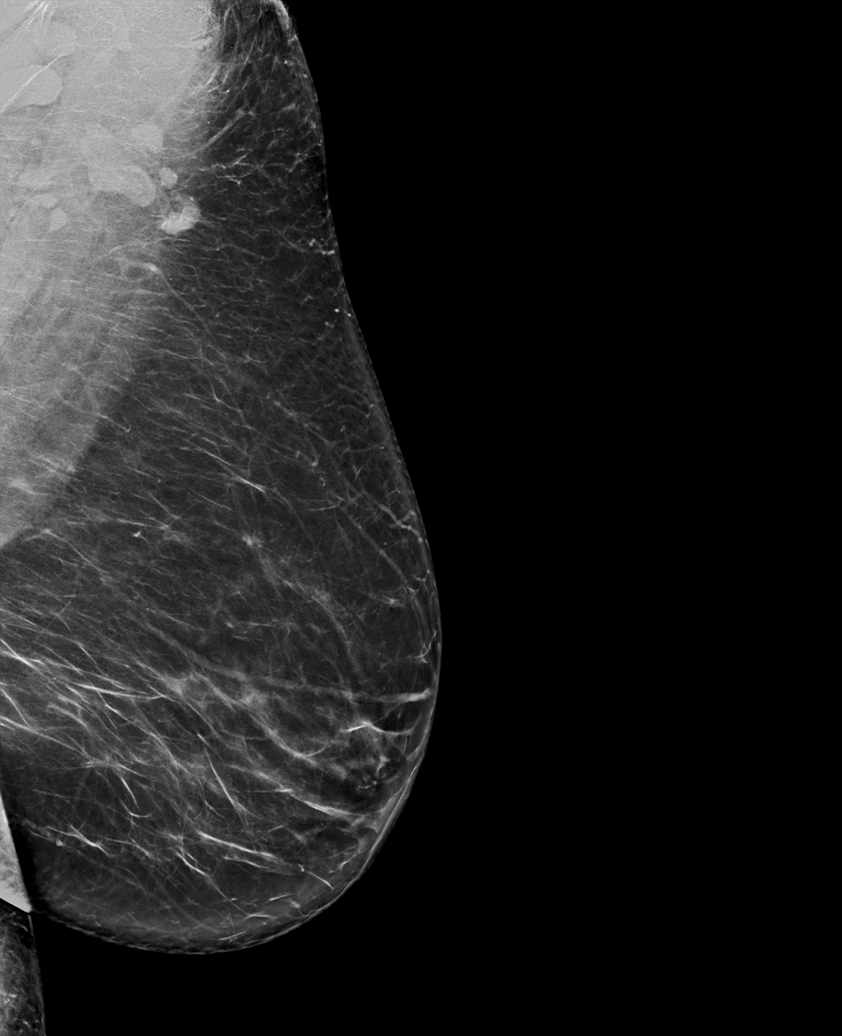

[L CC synth-2D]
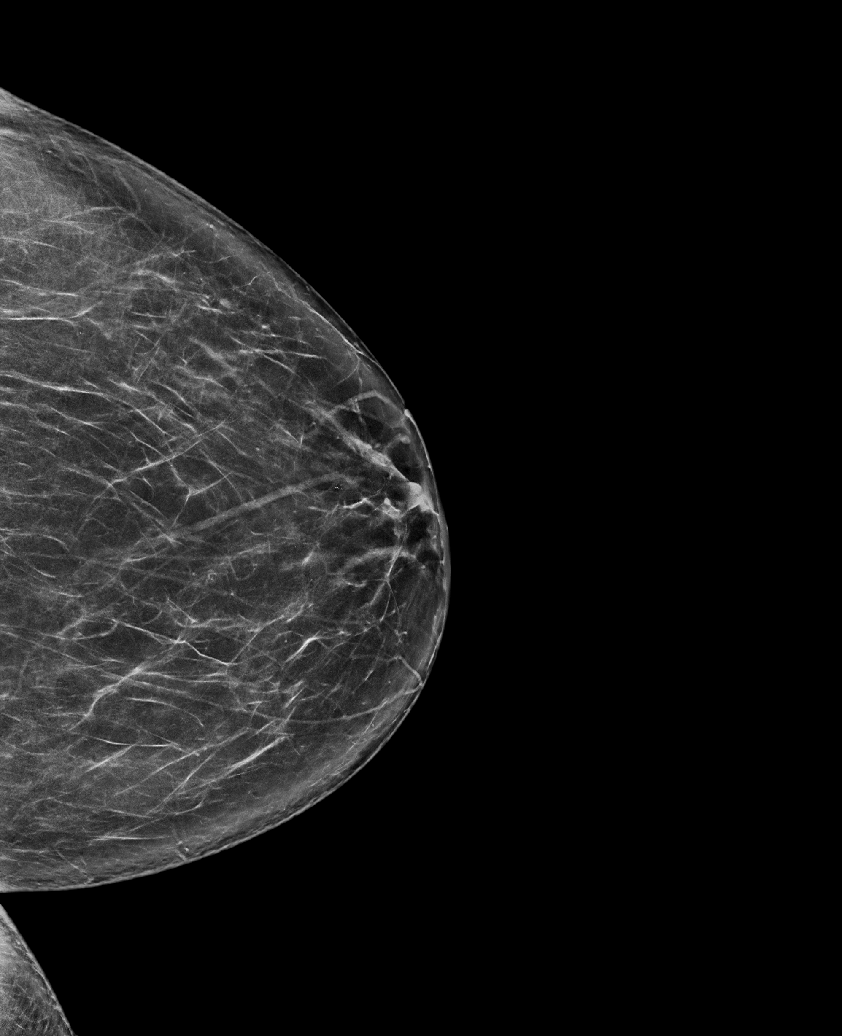

[R MLO tomo · tomo slice 42/83.0]
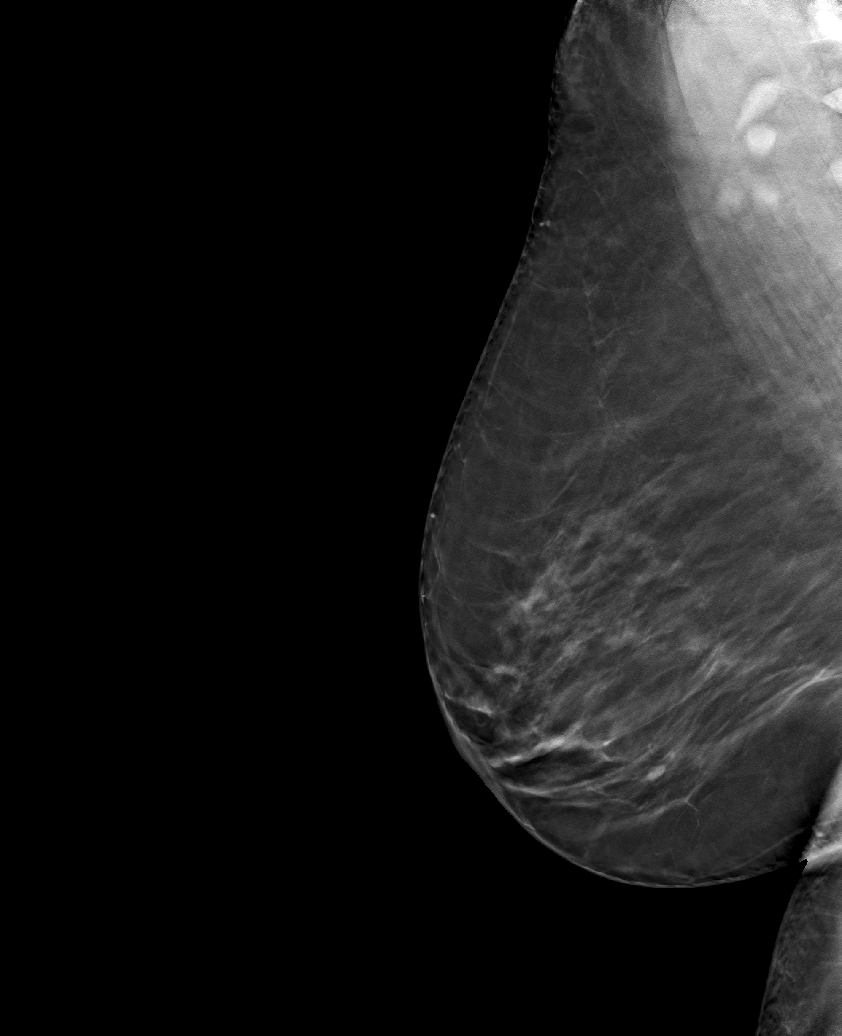

[6 of 30 positions shown; findings below may reference images not displayed]

ACR Breast Density Category b: There are scattered areas of
fibroglandular density.
FINDINGS: There are no findings suspicious for malignancy.
IMPRESSION: No mammographic evidence of malignancy. A result letter of this
screening mammogram will be mailed directly to the patient.

RECOMMENDATION:
Screening mammogram in one year. (Code:51-O-LD2)

BI-RADS CATEGORY  1: Negative.

## 2024-02-19 ENCOUNTER — Other Ambulatory Visit: Payer: Self-pay | Admitting: Cardiovascular Disease

## 2024-03-01 ENCOUNTER — Ambulatory Visit: Admitting: Internal Medicine

## 2024-03-01 ENCOUNTER — Encounter: Payer: Self-pay | Admitting: Internal Medicine

## 2024-03-01 VITALS — BP 110/76 | HR 88 | Ht 62.0 in | Wt 185.2 lb

## 2024-03-01 DIAGNOSIS — E782 Mixed hyperlipidemia: Secondary | ICD-10-CM

## 2024-03-01 DIAGNOSIS — E66812 Obesity, class 2: Secondary | ICD-10-CM

## 2024-03-01 DIAGNOSIS — R7303 Prediabetes: Secondary | ICD-10-CM | POA: Diagnosis not present

## 2024-03-01 DIAGNOSIS — I10 Essential (primary) hypertension: Secondary | ICD-10-CM | POA: Diagnosis not present

## 2024-03-01 DIAGNOSIS — K219 Gastro-esophageal reflux disease without esophagitis: Secondary | ICD-10-CM

## 2024-03-01 DIAGNOSIS — Z6838 Body mass index (BMI) 38.0-38.9, adult: Secondary | ICD-10-CM

## 2024-03-01 DIAGNOSIS — E6609 Other obesity due to excess calories: Secondary | ICD-10-CM

## 2024-03-01 NOTE — Progress Notes (Signed)
 Established Patient Office Visit  Subjective:  Patient ID: Jasmine Buck, female    DOB: 1961-02-26  Age: 63 y.o. MRN: [id number]  Chief Complaint  Patient presents with   Follow-up    2 month follow up    Patient comes in for her follow-up today.  She is currently on Wegovy  2.4 mg/week and is tolerating it well.  She has actually lost weight since she started injecting in her abdominal wall.  Denies nausea or vomiting, no abdominal pain, no diarrhea or constipation.  Patient had labs done at her work and they are essentially stable.  LDL still slightly above normal but her HDL is excellent.  Advised to continue strict diet control and exercise along with her medications.    No other concerns at this time.   Past Medical History:  Diagnosis Date   Fibroid    GERD (gastroesophageal reflux disease)    History of COVID-19    Hypertension    Mixed hyperlipidemia    Prediabetes     Past Surgical History:  Procedure Laterality Date   CESAREAN SECTION      Social History   Socioeconomic History   Marital status: Single    Spouse name: Not on file   Number of children: Not on file   Years of education: Not on file   Highest education level: Not on file  Occupational History   Not on file  Tobacco Use   Smoking status: Never   Smokeless tobacco: Never  Vaping Use   Vaping status: Never Used  Substance and Sexual Activity   Alcohol use: No   Drug use: No   Sexual activity: Yes  Other Topics Concern   Not on file  Social History Narrative   Not on file   Social Drivers of Health   Financial Resource Strain: Not on file  Food Insecurity: Not on file  Transportation Needs: Not on file  Physical Activity: Not on file  Stress: Not on file  Social Connections: Not on file  Intimate Partner Violence: Not on file    Family History  Problem Relation Age of Onset   Hypertension Mother    Hypertension Father    Breast cancer Neg Hx     Allergies  Allergen  Reactions   Ace Inhibitors Swelling    Outpatient Medications Prior to Visit  Medication Sig   amLODipine  (NORVASC ) 10 MG tablet Take 1 tablet (10 mg total) by mouth daily.   calcium carbonate (OSCAL) 1500 (600 Ca) MG TABS tablet Take 1,500 mg by mouth 2 (two) times daily with a meal.   Cholecalciferol (D 1000) 25 MCG (1000 UT) capsule 1,000 Units daily.   co-enzyme Q-10 30 MG capsule Take 30 mg by mouth 3 (three) times daily.   metoprolol succinate (TOPROL-XL) 100 MG 24 hr tablet TAKE 1 TABLET BY MOUTH ONCE  DAILY   Semaglutide -Weight Management (WEGOVY ) 2.4 MG/0.75ML SOAJ Inject 2.4 mg into the skin once a week.   Berberine Chloride 500 MG CAPS Take by mouth. (Patient not taking: Reported on 03/01/2024)   No facility-administered medications prior to visit.    Review of Systems  Constitutional: Negative.  Negative for chills, diaphoresis, fever, malaise/fatigue and weight loss.  HENT: Negative.  Negative for sore throat.   Eyes: Negative.   Respiratory: Negative.  Negative for cough and shortness of breath.   Cardiovascular: Negative.  Negative for chest pain, palpitations and leg swelling.  Gastrointestinal: Negative.  Negative for abdominal pain, constipation, diarrhea,  heartburn, nausea and vomiting.  Genitourinary: Negative.  Negative for dysuria and flank pain.  Musculoskeletal: Negative.  Negative for joint pain and myalgias.  Skin: Negative.   Neurological: Negative.  Negative for dizziness, tingling, tremors and headaches.  Endo/Heme/Allergies: Negative.   Psychiatric/Behavioral: Negative.  Negative for depression and suicidal ideas. The patient is not nervous/anxious.        Objective:   BP 110/76   Pulse 88   Ht 5' 2 (1.575 m)   Wt 185 lb 3.2 oz (84 kg)   SpO2 99%   BMI 33.87 kg/m   Vitals:   03/01/24 0927  BP: 110/76  Pulse: 88  Height: 5' 2 (1.575 m)  Weight: 185 lb 3.2 oz (84 kg)  SpO2: 99%  BMI (Calculated): 33.86    Physical Exam Vitals and  nursing note reviewed.  Constitutional:      Appearance: Normal appearance.  HENT:     Head: Normocephalic and atraumatic.     Nose: Nose normal.     Mouth/Throat:     Mouth: Mucous membranes are moist.     Pharynx: Oropharynx is clear.  Eyes:     Conjunctiva/sclera: Conjunctivae normal.     Pupils: Pupils are equal, round, and reactive to light.  Cardiovascular:     Rate and Rhythm: Normal rate and regular rhythm.     Pulses: Normal pulses.     Heart sounds: Normal heart sounds. No murmur heard. Pulmonary:     Effort: Pulmonary effort is normal.     Breath sounds: Normal breath sounds. No wheezing.  Abdominal:     General: Bowel sounds are normal.     Palpations: Abdomen is soft.     Tenderness: There is no abdominal tenderness. There is no right CVA tenderness or left CVA tenderness.  Musculoskeletal:        General: Normal range of motion.     Cervical back: Normal range of motion.     Right lower leg: No edema.     Left lower leg: No edema.  Skin:    General: Skin is warm and dry.  Neurological:     General: No focal deficit present.     Mental Status: She is alert and oriented to person, place, and time.  Psychiatric:        Mood and Affect: Mood normal.        Behavior: Behavior normal.      No results found for any visits on 03/01/24.  No results found for this or any previous visit (from the past 2160 hours).    Assessment & Plan:  Patient advised to continue all her medications along with strict diet control and exercise regimen. Problem List Items Addressed This Visit     Essential hypertension, benign - Primary   Pre-diabetes   Gastroesophageal reflux disease without esophagitis   Mixed hyperlipidemia   Class 2 obesity due to excess calories without serious comorbidity with body mass index (BMI) of 38.0 to 38.9 in adult    Return in about 2 months (around 05/01/2024).   Total time spent: 30 minutes  FERNAND FREDY RAMAN, MD  03/01/2024   This  document may have been prepared by [facility] Voice Recognition software and as such may include unintentional dictation errors.

## 2024-04-05 ENCOUNTER — Other Ambulatory Visit: Payer: Self-pay | Admitting: Internal Medicine

## 2024-04-05 DIAGNOSIS — R7303 Prediabetes: Secondary | ICD-10-CM

## 2024-04-05 DIAGNOSIS — E66812 Obesity, class 2: Secondary | ICD-10-CM

## 2024-05-03 ENCOUNTER — Ambulatory Visit (INDEPENDENT_AMBULATORY_CARE_PROVIDER_SITE_OTHER): Admitting: Internal Medicine

## 2024-05-03 ENCOUNTER — Encounter: Payer: Self-pay | Admitting: Internal Medicine

## 2024-05-03 VITALS — BP 118/80 | HR 87 | Ht 62.0 in | Wt 188.2 lb

## 2024-05-03 DIAGNOSIS — Z6834 Body mass index (BMI) 34.0-34.9, adult: Secondary | ICD-10-CM

## 2024-05-03 DIAGNOSIS — Z23 Encounter for immunization: Secondary | ICD-10-CM | POA: Diagnosis not present

## 2024-05-03 DIAGNOSIS — E66811 Obesity, class 1: Secondary | ICD-10-CM | POA: Diagnosis not present

## 2024-05-03 DIAGNOSIS — I10 Essential (primary) hypertension: Secondary | ICD-10-CM | POA: Diagnosis not present

## 2024-05-03 DIAGNOSIS — E782 Mixed hyperlipidemia: Secondary | ICD-10-CM

## 2024-05-03 DIAGNOSIS — E6609 Other obesity due to excess calories: Secondary | ICD-10-CM

## 2024-05-03 DIAGNOSIS — R7303 Prediabetes: Secondary | ICD-10-CM

## 2024-05-03 DIAGNOSIS — Z1211 Encounter for screening for malignant neoplasm of colon: Secondary | ICD-10-CM

## 2024-05-03 NOTE — Progress Notes (Signed)
 Established Patient Office Visit  Subjective:  Patient ID: Jasmine Buck, female    DOB: [birth year]  Age: 63 y.o. MRN: [id number]  Chief Complaint  Patient presents with   Follow-up    2 month follow up    Patient reports she is doing well today. She is taking her medications as prescribed without side effects. She reports she has not been eating a healthy diet and exercising as she should be. Patient will work on healthy lifestyle changes in addition to Wegovy  2.4 mg weekly injection to promote weight loss. Weight contributing to her hypertension and hyperlipidemia. Denies headache, chest pain, shortness of breath, abdominal pain, nausea, vomiting, diarrhea, constipation at this time.  Patient is due for colonoscopy; lat one was in 2015. Will send a referral  to GI. Patient would like a flu shot today.    No other concerns at this time.   Past Medical History:  Diagnosis Date   Fibroid    GERD (gastroesophageal reflux disease)    History of COVID-19    Hypertension    Mixed hyperlipidemia    Prediabetes     Past Surgical History:  Procedure Laterality Date   CESAREAN SECTION      Social History   Socioeconomic History   Marital status: Single    Spouse name: Not on file   Number of children: Not on file   Years of education: Not on file   Highest education level: Not on file  Occupational History   Not on file  Tobacco Use   Smoking status: Never   Smokeless tobacco: Never  Vaping Use   Vaping status: Never Used  Substance and Sexual Activity   Alcohol use: No   Drug use: No   Sexual activity: Yes  Other Topics Concern   Not on file  Social History Narrative   Not on file   Social Drivers of Health   Financial Resource Strain: Not on file  Food Insecurity: Not on file  Transportation Needs: Not on file  Physical Activity: Not on file  Stress: Not on file  Social Connections: Not on file  Intimate Partner Violence: Not on file    Family  History  Problem Relation Age of Onset   Hypertension Mother    Hypertension Father    Breast cancer Neg Hx     Allergies  Allergen Reactions   Ace Inhibitors Swelling    Outpatient Medications Prior to Visit  Medication Sig   amLODipine  (NORVASC ) 10 MG tablet Take 1 tablet (10 mg total) by mouth daily.   calcium carbonate (OSCAL) 1500 (600 Ca) MG TABS tablet Take 1,500 mg by mouth 2 (two) times daily with a meal.   Cholecalciferol (D 1000) 25 MCG (1000 UT) capsule 1,000 Units daily.   co-enzyme Q-10 30 MG capsule Take 30 mg by mouth 3 (three) times daily.   metoprolol succinate (TOPROL-XL) 100 MG 24 hr tablet TAKE 1 TABLET BY MOUTH ONCE  DAILY   semaglutide -weight management (WEGOVY ) 2.4 MG/0.75ML SOAJ SQ injection INJECT 2.4 MG INTO THE SKIN ONCE A WEEK.   Berberine Chloride 500 MG CAPS Take by mouth. (Patient not taking: Reported on 05/03/2024)   No facility-administered medications prior to visit.    Review of Systems  Constitutional: Negative.  Negative for chills, fever and malaise/fatigue.  HENT: Negative.  Negative for congestion and sore throat.   Eyes: Negative.  Negative for blurred vision and pain.  Respiratory: Negative.  Negative for cough and shortness of  breath.   Cardiovascular: Negative.  Negative for chest pain, palpitations and leg swelling.  Gastrointestinal: Negative.  Negative for abdominal pain, blood in stool, constipation, diarrhea, heartburn, melena, nausea and vomiting.  Genitourinary: Negative.  Negative for dysuria, flank pain, frequency and urgency.  Musculoskeletal: Negative.  Negative for joint pain and myalgias.  Skin: Negative.   Neurological: Negative.  Negative for dizziness, tingling, sensory change, weakness and headaches.  Endo/Heme/Allergies: Negative.   Psychiatric/Behavioral: Negative.  Negative for depression and suicidal ideas. The patient is not nervous/anxious.        Objective:   BP 118/80   Pulse 87   Ht 5' 2 (1.575 m)    Wt 188 lb 3.2 oz (85.4 kg)   SpO2 99%   BMI 34.42 kg/m   Vitals:   05/03/24 0928  BP: 118/80  Pulse: 87  Height: 5' 2 (1.575 m)  Weight: 188 lb 3.2 oz (85.4 kg)  SpO2: 99%  BMI (Calculated): 34.41    Physical Exam Vitals and nursing note reviewed.  Constitutional:      Appearance: Normal appearance.  HENT:     Head: Normocephalic and atraumatic.     Nose: Nose normal.     Mouth/Throat:     Mouth: Mucous membranes are moist.     Pharynx: Oropharynx is clear.  Eyes:     Conjunctiva/sclera: Conjunctivae normal.     Pupils: Pupils are equal, round, and reactive to light.  Cardiovascular:     Rate and Rhythm: Normal rate and regular rhythm.     Pulses: Normal pulses.     Heart sounds: Normal heart sounds. No murmur heard. Pulmonary:     Effort: Pulmonary effort is normal.     Breath sounds: Normal breath sounds. No wheezing.  Abdominal:     General: Bowel sounds are normal.     Palpations: Abdomen is soft.     Tenderness: There is no abdominal tenderness. There is no right CVA tenderness or left CVA tenderness.  Musculoskeletal:        General: Normal range of motion.     Cervical back: Normal range of motion.     Right lower leg: No edema.     Left lower leg: No edema.  Skin:    General: Skin is warm and dry.  Neurological:     General: No focal deficit present.     Mental Status: She is alert and oriented to person, place, and time.  Psychiatric:        Mood and Affect: Mood normal.        Behavior: Behavior normal.      No results found for any visits on 05/03/24.  No results found for this or any previous visit (from the past 2160 hours).    Assessment & Plan:  Continue taking medications as prescribed. Labs to be collected today and FU with patient on results. Reinforced healthy diet and exercise as tolerated. Flu shot given. GI referral sent. Problem List Items Addressed This Visit     Essential hypertension, benign   Relevant Orders   CMP14+EGFR    CBC with Diff   Pre-diabetes   Relevant Orders   Hemoglobin A1c   Mixed hyperlipidemia   Relevant Orders   Lipid Panel w/o Chol/HDL Ratio   CBC with Diff   Class 2 obesity due to excess calories without serious comorbidity with body mass index (BMI) of 38.0 to 38.9 in adult   Flu vaccine need - Primary   Relevant Orders  Influenza, MDCK, trivalent, PF(Flucelvax egg-free)   Other Visit Diagnoses       Colon cancer screening       Relevant Orders   Ambulatory referral to Gastroenterology       Follow up 6 weeks   Total time spent: 25 minutes.  This time includes review of previous notes and results and patient face to face interaction during today's visit.    FERNAND FREDY RAMAN, MD  05/03/2024   This document may have been prepared by [facility] Voice Recognition software and as such may include unintentional dictation errors.

## 2024-05-04 LAB — LIPID PANEL W/O CHOL/HDL RATIO
Cholesterol, Total: 194 mg/dL (ref 100–199)
HDL: 66 mg/dL (ref >39)
LDL Chol Calc (NIH): 120 mg/dL — ABNORMAL HIGH (ref 0–99)
Triglycerides: 44 mg/dL (ref 0–149)
VLDL Cholesterol Cal: 8 mg/dL (ref 5–40)

## 2024-05-04 LAB — CBC WITH DIFFERENTIAL/PLATELET
Basophils Absolute: 0 x10E3/uL (ref 0.0–0.2)
Basos: 1 %
EOS (ABSOLUTE): 0.1 x10E3/uL (ref 0.0–0.4)
Eos: 1 %
Hematocrit: 35.7 % (ref 34.0–46.6)
Hemoglobin: 11.8 g/dL (ref 11.1–15.9)
Immature Grans (Abs): 0 x10E3/uL (ref 0.0–0.1)
Immature Granulocytes: 0 %
Lymphocytes Absolute: 3.2 x10E3/uL — ABNORMAL HIGH (ref 0.7–3.1)
Lymphs: 47 %
MCH: 29.1 pg (ref 26.6–33.0)
MCHC: 33.1 g/dL (ref 31.5–35.7)
MCV: 88 fL (ref 79–97)
Monocytes Absolute: 0.5 x10E3/uL (ref 0.1–0.9)
Monocytes: 8 %
Neutrophils Absolute: 2.9 x10E3/uL (ref 1.4–7.0)
Neutrophils: 43 %
Platelets: 365 x10E3/uL (ref 150–450)
RBC: 4.06 x10E6/uL (ref 3.77–5.28)
RDW: 13 % (ref 11.7–15.4)
WBC: 6.7 x10E3/uL (ref 3.4–10.8)

## 2024-05-04 LAB — CMP14+EGFR
ALT: 11 IU/L (ref 0–32)
AST: 12 IU/L (ref 0–40)
Albumin: 4.2 g/dL (ref 3.9–4.9)
Alkaline Phosphatase: 111 IU/L (ref 49–135)
BUN/Creatinine Ratio: 16 (ref 12–28)
BUN: 14 mg/dL (ref 8–27)
Bilirubin Total: 0.5 mg/dL (ref 0.0–1.2)
CO2: 21 mmol/L (ref 20–29)
Calcium: 9.6 mg/dL (ref 8.7–10.3)
Chloride: 103 mmol/L (ref 96–106)
Creatinine, Ser: 0.89 mg/dL (ref 0.57–1.00)
Globulin, Total: 3.5 g/dL (ref 1.5–4.5)
Glucose: 85 mg/dL (ref 70–99)
Potassium: 4.1 mmol/L (ref 3.5–5.2)
Sodium: 138 mmol/L (ref 134–144)
Total Protein: 7.7 g/dL (ref 6.0–8.5)
eGFR: 74 mL/min/1.73 (ref >59)

## 2024-05-04 LAB — HEMOGLOBIN A1C
Est. average glucose Bld gHb Est-mCnc: 114 mg/dL
Hgb A1c MFr Bld: 5.6 % (ref 4.8–5.6)

## 2024-05-06 ENCOUNTER — Ambulatory Visit: Payer: Self-pay | Admitting: Internal Medicine

## 2024-05-07 NOTE — Progress Notes (Signed)
Pt informed

## 2024-05-20 ENCOUNTER — Other Ambulatory Visit: Payer: Self-pay | Admitting: Cardiology

## 2024-07-04 ENCOUNTER — Encounter: Payer: Self-pay | Admitting: Internal Medicine

## 2024-07-04 ENCOUNTER — Ambulatory Visit: Admitting: Internal Medicine

## 2024-07-04 VITALS — BP 110/78 | HR 83 | Ht 62.0 in | Wt 185.4 lb

## 2024-07-04 DIAGNOSIS — E538 Deficiency of other specified B group vitamins: Secondary | ICD-10-CM | POA: Diagnosis not present

## 2024-07-04 DIAGNOSIS — R5383 Other fatigue: Secondary | ICD-10-CM | POA: Insufficient documentation

## 2024-07-04 DIAGNOSIS — E782 Mixed hyperlipidemia: Secondary | ICD-10-CM | POA: Diagnosis not present

## 2024-07-04 DIAGNOSIS — Z6833 Body mass index (BMI) 33.0-33.9, adult: Secondary | ICD-10-CM

## 2024-07-04 DIAGNOSIS — R7303 Prediabetes: Secondary | ICD-10-CM | POA: Diagnosis not present

## 2024-07-04 DIAGNOSIS — E559 Vitamin D deficiency, unspecified: Secondary | ICD-10-CM | POA: Insufficient documentation

## 2024-07-04 DIAGNOSIS — I10 Essential (primary) hypertension: Secondary | ICD-10-CM | POA: Diagnosis not present

## 2024-07-04 DIAGNOSIS — E66811 Obesity, class 1: Secondary | ICD-10-CM | POA: Diagnosis not present

## 2024-07-04 DIAGNOSIS — E6609 Other obesity due to excess calories: Secondary | ICD-10-CM | POA: Insufficient documentation

## 2024-07-04 NOTE — Progress Notes (Signed)
 Established Patient Office Visit  Subjective:  Patient ID: Jasmine Buck, female    DOB: 05-18-1961  Age: 63 y.o. MRN: [id number]  Chief Complaint  Patient presents with   Follow-up    2 month follow up     Patient is here today for follow up.  She is scheduled 08/06/24 for colonoscopy consult. Mammogram is due 08/2024. GYN physical 07/2024    No other concerns at this time.   Past Medical History:  Diagnosis Date   Fibroid    GERD (gastroesophageal reflux disease)    History of COVID-19    Hypertension    Mixed hyperlipidemia    Prediabetes     Past Surgical History:  Procedure Laterality Date   CESAREAN SECTION      Social History   Socioeconomic History   Marital status: Single    Spouse name: Not on file   Number of children: Not on file   Years of education: Not on file   Highest education level: Not on file  Occupational History   Not on file  Tobacco Use   Smoking status: Never   Smokeless tobacco: Never  Vaping Use   Vaping status: Never Used  Substance and Sexual Activity   Alcohol use: No   Drug use: No   Sexual activity: Yes  Other Topics Concern   Not on file  Social History Narrative   Not on file   Social Drivers of Health   Tobacco Use: Low Risk (07/04/2024)   Patient History    Smoking Tobacco Use: Never    Smokeless Tobacco Use: Never    Passive Exposure: Not on file  Financial Resource Strain: Not on file  Food Insecurity: Not on file  Transportation Needs: Not on file  Physical Activity: Not on file  Stress: Not on file  Social Connections: Not on file  Intimate Partner Violence: Not on file  Depression (PHQ2-9): Low Risk (08/25/2023)   Depression (PHQ2-9)    PHQ-2 Score: 1  Alcohol Screen: Not on file  Housing: Unknown (10/23/2023)   Received from [facility]   Epic    Unable to Pay for Housing in the Last Year: Not on file    Number of Times Moved in the Last Year: Not on file    At any time in the  past 12 months, were you homeless or living in a shelter (including now)?: No  Utilities: Not on file  Health Literacy: Not on file    Family History  Problem Relation Age of Onset   Hypertension Mother    Hypertension Father    Breast cancer Neg Hx     Allergies[1]  Show/hide medication list[2]  Review of Systems  Constitutional: Negative.  Negative for chills, fever and malaise/fatigue.  HENT: Negative.  Negative for congestion and sore throat.   Eyes: Negative.  Negative for blurred vision and pain.  Respiratory: Negative.  Negative for cough and shortness of breath.   Cardiovascular: Negative.  Negative for chest pain, palpitations and leg swelling.  Gastrointestinal: Negative.  Negative for abdominal pain, blood in stool, constipation, diarrhea, heartburn, melena, nausea and vomiting.  Genitourinary: Negative.  Negative for dysuria, flank pain, frequency and urgency.  Musculoskeletal: Negative.  Negative for joint pain and myalgias.  Skin: Negative.   Neurological: Negative.  Negative for dizziness, tingling, sensory change, weakness and headaches.  Endo/Heme/Allergies: Negative.   Psychiatric/Behavioral: Negative.  Negative for depression and suicidal ideas. The patient is not nervous/anxious.  Objective:   BP 110/78   Pulse 83   Ht 5' 2 (1.575 m)   Wt 185 lb 6.4 oz (84.1 kg)   SpO2 99%   BMI 33.91 kg/m   Vitals:   07/04/24 0947  BP: 110/78  Pulse: 83  Height: 5' 2 (1.575 m)  Weight: 185 lb 6.4 oz (84.1 kg)  SpO2: 99%  BMI (Calculated): 33.9    Physical Exam Vitals and nursing note reviewed.  Constitutional:      Appearance: Normal appearance.  HENT:     Head: Normocephalic and atraumatic.     Nose: Nose normal.     Mouth/Throat:     Mouth: Mucous membranes are moist.     Pharynx: Oropharynx is clear.  Eyes:     Conjunctiva/sclera: Conjunctivae normal.     Pupils: Pupils are equal, round, and reactive to light.  Cardiovascular:     Rate  and Rhythm: Normal rate and regular rhythm.     Pulses: Normal pulses.     Heart sounds: Normal heart sounds. No murmur heard. Pulmonary:     Effort: Pulmonary effort is normal.     Breath sounds: Normal breath sounds. No wheezing.  Abdominal:     General: Bowel sounds are normal.     Palpations: Abdomen is soft.     Tenderness: There is no abdominal tenderness. There is no right CVA tenderness or left CVA tenderness.  Musculoskeletal:        General: Normal range of motion.     Cervical back: Normal range of motion.     Right lower leg: No edema.     Left lower leg: No edema.  Skin:    General: Skin is warm and dry.  Neurological:     General: No focal deficit present.     Mental Status: She is alert and oriented to person, place, and time.  Psychiatric:        Mood and Affect: Mood normal.        Behavior: Behavior normal.      No results found for any visits on 07/04/24.  Recent Results (from the past 2160 hours)  CMP14+EGFR     Status: None   Collection Time: 05/03/24  9:56 AM  Result Value Ref Range   Glucose 85 70 - 99 mg/dL   BUN 14 8 - 27 mg/dL   Creatinine, Ser 9.10 0.57 - 1.00 mg/dL   eGFR 74 >40 fO/fpw/8.26   BUN/Creatinine Ratio 16 12 - 28   Sodium 138 134 - 144 mmol/L   Potassium 4.1 3.5 - 5.2 mmol/L   Chloride 103 96 - 106 mmol/L   CO2 21 20 - 29 mmol/L   Calcium 9.6 8.7 - 10.3 mg/dL   Total Protein 7.7 6.0 - 8.5 g/dL   Albumin 4.2 3.9 - 4.9 g/dL   Globulin, Total 3.5 1.5 - 4.5 g/dL   Bilirubin Total 0.5 0.0 - 1.2 mg/dL   Alkaline Phosphatase 111 49 - 135 IU/L   AST 12 0 - 40 IU/L   ALT 11 0 - 32 IU/L  Lipid Panel w/o Chol/HDL Ratio     Status: Abnormal   Collection Time: 05/03/24  9:56 AM  Result Value Ref Range   Cholesterol, Total 194 100 - 199 mg/dL   Triglycerides 44 0 - 149 mg/dL   HDL 66 >60 mg/dL   VLDL Cholesterol Cal 8 5 - 40 mg/dL   LDL Chol Calc (NIH) 879 (H) 0 - 99 mg/dL  Hemoglobin  A1c     Status: None   Collection Time: 05/03/24   9:56 AM  Result Value Ref Range   Hgb A1c MFr Bld 5.6 4.8 - 5.6 %    Comment:          Prediabetes: 5.7 - 6.4          Diabetes: >6.4          Glycemic control for adults with diabetes: <7.0    Est. average glucose Bld gHb Est-mCnc 114 mg/dL  CBC with Diff     Status: Abnormal   Collection Time: 05/03/24  9:56 AM  Result Value Ref Range   WBC 6.7 3.4 - 10.8 x10E3/uL   RBC 4.06 3.77 - 5.28 x10E6/uL   Hemoglobin 11.8 11.1 - 15.9 g/dL   Hematocrit 64.2 65.9 - 46.6 %   MCV 88 79 - 97 fL   MCH 29.1 26.6 - 33.0 pg   MCHC 33.1 31.5 - 35.7 g/dL   RDW 86.9 88.2 - 84.5 %   Platelets 365 150 - 450 x10E3/uL   Neutrophils 43 Not Estab. %   Lymphs 47 Not Estab. %   Monocytes 8 Not Estab. %   Eos 1 Not Estab. %   Basos 1 Not Estab. %   Neutrophils Absolute 2.9 1.4 - 7.0 x10E3/uL   Lymphocytes Absolute 3.2 (H) 0.7 - 3.1 x10E3/uL   Monocytes Absolute 0.5 0.1 - 0.9 x10E3/uL   EOS (ABSOLUTE) 0.1 0.0 - 0.4 x10E3/uL   Basophils Absolute 0.0 0.0 - 0.2 x10E3/uL   Immature Granulocytes 0 Not Estab. %   Immature Grans (Abs) 0.0 0.0 - 0.1 x10E3/uL      Assessment & Plan:  Continue medications as prescribed. Reinforced healthy diet and exercise as tolerated.  Check routine labs in 1 month.  Problem List Items Addressed This Visit       Cardiovascular and Mediastinum   Essential hypertension, benign - Primary   Relevant Orders   CMP14+EGFR     Other   Pre-diabetes   Relevant Orders   Hemoglobin A1c   Mixed hyperlipidemia   Relevant Orders   Lipid panel   Vitamin D deficiency   Relevant Orders   VITAMIN D 25 Hydroxy (Vit-D Deficiency, Fractures)   B12 deficiency   Relevant Orders   Vitamin B12   Fatigue   Relevant Orders   TSH   Class 1 obesity due to excess calories with serious comorbidity and body mass index (BMI) of 33.0 to 33.9 in adult    Return in about 2 months (around 09/04/2024).   Total time spent: 25 minutes. This time includes review of previous notes and results and  patient face to face interaction during today's visit.    FERNAND FREDY RAMAN, MD  07/04/2024   This document may have been prepared by [facility] Voice Recognition software and as such may include unintentional dictation errors.      [1]  Allergies Allergen Reactions   Ace Inhibitors Swelling  [2]  Outpatient Medications Prior to Visit  Medication Sig   amLODipine  (NORVASC ) 10 MG tablet Take 1 tablet (10 mg total) by mouth daily.   calcium carbonate (OSCAL) 1500 (600 Ca) MG TABS tablet Take 1,500 mg by mouth 2 (two) times daily with a meal.   Cholecalciferol (D 1000) 25 MCG (1000 UT) capsule 1,000 Units daily.   co-enzyme Q-10 30 MG capsule Take 30 mg by mouth 3 (three) times daily.   metoprolol succinate (TOPROL-XL) 100 MG 24 hr tablet TAKE 1  TABLET BY MOUTH ONCE  DAILY   semaglutide -weight management (WEGOVY ) 2.4 MG/0.75ML SOAJ SQ injection INJECT 2.4 MG INTO THE SKIN ONCE A WEEK.   [DISCONTINUED] Berberine Chloride 500 MG CAPS Take by mouth. (Patient not taking: Reported on 07/04/2024)   No facility-administered medications prior to visit.

## 2024-07-30 ENCOUNTER — Other Ambulatory Visit: Payer: Self-pay | Admitting: Internal Medicine

## 2024-07-30 DIAGNOSIS — E66812 Obesity, class 2: Secondary | ICD-10-CM

## 2024-07-30 DIAGNOSIS — R7303 Prediabetes: Secondary | ICD-10-CM

## 2024-08-01 NOTE — Progress Notes (Unsigned)
 "  ANNUAL PREVENTATIVE CARE GYNECOLOGY  ENCOUNTER NOTE  SUBJECTIVE:       Jasmine Buck  is a 64 y.o. [phone] female here for a routine annual gynecologic exam. The patient {is/is not/has never been:13135} sexually active. The patient {is/is not:13135} taking hormone replacement therapy. {post-men bleed:13152::Patient denies post-menopausal vaginal bleeding.} Family history of breast, uterine, ovarian cancer: {yes/no:311178}. The patient wears seatbelts: {yes/no:311178}. The patient participates in regular exercise: {yes/no/not asked:9010}. Has the patient ever been transfused or tattooed?: {yes/no/not asked:9010}. The patient reports that there {is/is not:9024} domestic violence in her life. Has the patient completed the Gardasil vaccine? {yes/no:311178}.  Current complaints: 1.  ***    Gynecologic History No LMP recorded. Patient is postmenopausal. Contraception: post menopausal status Last Pap: 07/27/2023. Results were: normal History of abnormal pap: *** History of STIs: *** Last Mammogram: 09/01/2023. Results were: normal Last Colonoscopy: 08/29/2012 Last Dexa Scan:  NA  PHQ-2:     08/25/2023   12:39 PM  Depression screen PHQ 2/9  Decreased Interest 0  Down, Depressed, Hopeless 0  PHQ - 2 Score 0  Altered sleeping 1  Tired, decreased energy 0  Change in appetite 0  Feeling bad or failure about yourself  0  Trouble concentrating 0  Moving slowly or fidgety/restless 0  Suicidal thoughts 0  PHQ-9 Score 1   Difficult doing work/chores Not difficult at all     Data saved with a previous flowsheet row definition    Obstetric History OB History  Gravida Para Term Preterm AB Living  4 2 2  0 2 2  SAB IAB Ectopic Multiple Live Births  0 2 0 0 2    # Outcome Date GA Lbr Len/2nd Weight Sex Type Anes PTL Lv  4 Term 06/28/01   5 lb 15 oz (2.693 kg) M CS-Unspec   LIV  3 IAB 1991          2 IAB 1988          1 Term 09/20/83   7 lb 6 oz (3.345 kg) M Vag-Spont   LIV     Past Medical History:  Diagnosis Date   Fibroid    GERD (gastroesophageal reflux disease)    History of COVID-19    Hypertension    Mixed hyperlipidemia    Prediabetes     Family History  Problem Relation Age of Onset   Hypertension Mother    Hypertension Father    Breast cancer Neg Hx     Past Surgical History:  Procedure Laterality Date   CESAREAN SECTION      Social History   Socioeconomic History   Marital status: Single    Spouse name: Not on file   Number of children: Not on file   Years of education: Not on file   Highest education level: Not on file  Occupational History   Not on file  Tobacco Use   Smoking status: Never   Smokeless tobacco: Never  Vaping Use   Vaping status: Never Used  Substance and Sexual Activity   Alcohol use: No   Drug use: No   Sexual activity: Yes  Other Topics Concern   Not on file  Social History Narrative   Not on file   Social Drivers of Health   Tobacco Use: Low Risk (07/04/2024)   Patient History    Smoking Tobacco Use: Never    Smokeless Tobacco Use: Never    Passive Exposure: Not on file  Financial Resource Strain: Not on file  Food Insecurity: Not on file  Transportation Needs: Not on file  Physical Activity: Not on file  Stress: Not on file  Social Connections: Not on file  Intimate Partner Violence: Not on file  Depression (PHQ2-9): Low Risk (08/25/2023)   Depression (PHQ2-9)    PHQ-2 Score: 1  Alcohol Screen: Not on file  Housing: Unknown (10/23/2023)   Received from [facility]   Epic    Unable to Pay for Housing in the Last Year: Not on file    Number of Times Moved in the Last Year: Not on file    At any time in the past 12 months, were you homeless or living in a shelter (including now)?: No  Utilities: Not on file  Health Literacy: Not on file    Medications Ordered Prior to Encounter[1]  Allergies[2]   Review of Systems ROS Review of Systems - General ROS: negative  for - chills, fatigue, fever, hot flashes, night sweats, weight gain or weight loss Psychological ROS: negative for - anxiety, decreased libido, depression, mood swings, physical abuse or sexual abuse Ophthalmic ROS: negative for - blurry vision, eye pain or loss of vision ENT ROS: negative for - headaches, hearing change, visual changes or vocal changes Allergy and Immunology ROS: negative for - hives, itchy/watery eyes or seasonal allergies Hematological and Lymphatic ROS: negative for - bleeding problems, bruising, swollen lymph nodes or weight loss Endocrine ROS: negative for - galactorrhea, hair pattern changes, hot flashes, malaise/lethargy, mood swings, palpitations, polydipsia/polyuria, skin changes, temperature intolerance or unexpected weight changes Breast ROS: negative for - new or changing breast lumps or nipple discharge Respiratory ROS: negative for - cough or shortness of breath Cardiovascular ROS: negative for - chest pain, irregular heartbeat, palpitations or shortness of breath Gastrointestinal ROS: no abdominal pain, change in bowel habits, or black or bloody stools Genito-Urinary ROS: no dysuria, trouble voiding, or hematuria Musculoskeletal ROS: negative for - joint pain or joint stiffness Neurological ROS: negative for - bowel and bladder control changes Dermatological ROS: negative for rash and skin lesion changes   OBJECTIVE:   There were no vitals taken for this visit.  CONSTITUTIONAL: Well-developed, well-nourished female in no acute distress.  PSYCHIATRIC: Normal mood and affect. Normal behavior. Normal judgment and thought content. NEUROLGIC: Alert and oriented to person, place, and time. Normal muscle tone coordination. No cranial nerve deficit noted. HENT:  Normocephalic, atraumatic, External right and left ear normal. Oropharynx is clear and moist EYES: Conjunctivae and EOM are normal. No scleral icterus.  NECK: Normal range of motion, supple, no masses.   Normal thyroid .  SKIN: Skin is warm and dry. No rash noted. Not diaphoretic. No erythema. No pallor. CARDIOVASCULAR: Normal heart rate noted, regular rhythm, no murmur. RESPIRATORY: Clear to auscultation bilaterally. Effort and breath sounds normal, no problems with respiration noted. BREASTS: Symmetric in size. No masses, skin changes, nipple drainage, or lymphadenopathy. ABDOMEN: Soft, normal bowel sounds, no distention noted.  No tenderness, rebound or guarding.  PELVIC:  Bladder {:311640}  Urethra: {:311719}  Vulva: {:311722}  Vagina: {:311643}  Cervix: {:311644}  Uterus: {:311718}  Adnexa: {:311645}  RV: {Blank multiple:19196::External Exam NormaI,No Rectal Masses,Normal Sphincter tone}  MUSCULOSKELETAL: Normal range of motion. No tenderness.  No cyanosis, clubbing, or edema.  2+ distal pulses. LYMPHATIC: No Axillary, Supraclavicular, or Inguinal Adenopathy.  Labs: Lab Results  Component Value Date   WBC 6.7 05/03/2024   HGB 11.8 05/03/2024   HCT 35.7 05/03/2024   MCV 88 05/03/2024   PLT  365 05/03/2024    Lab Results  Component Value Date   CREATININE 0.89 05/03/2024   BUN 14 05/03/2024   NA 138 05/03/2024   K 4.1 05/03/2024   CL 103 05/03/2024   CO2 21 05/03/2024    Lab Results  Component Value Date   ALT 11 05/03/2024   AST 12 05/03/2024   ALKPHOS 111 05/03/2024   BILITOT 0.5 05/03/2024    Lab Results  Component Value Date   CHOL 194 05/03/2024   HDL 66 05/03/2024   LDLCALC 120 (H) 05/03/2024   TRIG 44 05/03/2024    Lab Results  Component Value Date   TSH 2.042 02/03/2018    Lab Results  Component Value Date   HGBA1C 5.6 05/03/2024     ASSESSMENT:   No diagnosis found.   PLAN:   Jasmine Buck is a 64 y.o. [phone] female here today for her annual exam, doing well.  Pap: done with cotesting today Mammogram: ordered***due *** Colon: PCP *** ordered colonoscopy***Cologuard -OR- due *** Labs: ***A1C, CMP, HepC, Lipid panel, Vit D,  TSH PHQ-2 = ***, discussed coping techniques; RTC if worsens or develops concern Contraception: *** Healthy lifestyle modifications discussed: multivitamin, diet, exercise, sunscreen, tobacco and alcohol use. Emphasized importance of regular physical activity.  Calcium and Vit D recommendation reviewed.  All questions answered to patient's satisfaction.   Follow up 1 yr for annual, sooner prn.    Estil Mangle, DO [facility]                 [1]  Current Outpatient Medications on File Prior to Visit  Medication Sig Dispense Refill   amLODipine  (NORVASC ) 10 MG tablet Take 1 tablet (10 mg total) by mouth daily. 90 tablet 3   calcium carbonate (OSCAL) 1500 (600 Ca) MG TABS tablet Take 1,500 mg by mouth 2 (two) times daily with a meal.     Cholecalciferol (D 1000) 25 MCG (1000 UT) capsule 1,000 Units daily.     co-enzyme Q-10 30 MG capsule Take 30 mg by mouth 3 (three) times daily.     metoprolol succinate (TOPROL-XL) 100 MG 24 hr tablet TAKE 1 TABLET BY MOUTH ONCE  DAILY 90 tablet 3   semaglutide -weight management (WEGOVY ) 2.4 MG/0.75ML SOAJ SQ injection INJECT 2.4 MG INTO THE SKIN ONCE A WEEK. 3 mL 3   No current facility-administered medications on file prior to visit.  [2]  Allergies Allergen Reactions   Ace Inhibitors Swelling   "

## 2024-08-06 ENCOUNTER — Ambulatory Visit: Admitting: Obstetrics

## 2024-08-06 DIAGNOSIS — Z1231 Encounter for screening mammogram for malignant neoplasm of breast: Secondary | ICD-10-CM

## 2024-08-06 DIAGNOSIS — Z01419 Encounter for gynecological examination (general) (routine) without abnormal findings: Secondary | ICD-10-CM

## 2024-09-06 ENCOUNTER — Encounter

## 2024-09-06 ENCOUNTER — Ambulatory Visit: Admitting: Internal Medicine

## 2024-09-17 ENCOUNTER — Ambulatory Visit: Admitting: Obstetrics
